# Patient Record
Sex: Male | Born: 1940 | Race: White | Hispanic: No | Marital: Married | Smoking: Never smoker
Health system: Southern US, Community
[De-identification: ages and names within clinical notes are randomized; demographics above are authoritative.]

## PROBLEM LIST (undated history)

## (undated) DIAGNOSIS — K7030 Alcoholic cirrhosis of liver without ascites: Secondary | ICD-10-CM

## (undated) DIAGNOSIS — D696 Thrombocytopenia, unspecified: Secondary | ICD-10-CM

## (undated) DIAGNOSIS — K7682 Hepatic encephalopathy: Secondary | ICD-10-CM

## (undated) DIAGNOSIS — D649 Anemia, unspecified: Secondary | ICD-10-CM

## (undated) DIAGNOSIS — R17 Unspecified jaundice: Secondary | ICD-10-CM

## (undated) DIAGNOSIS — K7290 Hepatic failure, unspecified without coma: Secondary | ICD-10-CM

## (undated) HISTORY — DX: Alcoholic cirrhosis of liver without ascites: K70.30

## (undated) HISTORY — PX: OTHER SURGICAL HISTORY: SHX169

---

## 2004-02-26 ENCOUNTER — Inpatient Hospital Stay (HOSPITAL_COMMUNITY): Admission: EM | Admit: 2004-02-26 | Discharge: 2004-02-27 | Payer: Self-pay | Admitting: Emergency Medicine

## 2004-02-26 ENCOUNTER — Ambulatory Visit (HOSPITAL_COMMUNITY): Admission: RE | Admit: 2004-02-26 | Discharge: 2004-02-26 | Payer: Self-pay | Admitting: Family Medicine

## 2004-04-26 ENCOUNTER — Encounter: Admission: RE | Admit: 2004-04-26 | Discharge: 2004-04-26 | Payer: Self-pay | Admitting: Neurosurgery

## 2004-06-03 ENCOUNTER — Encounter: Admission: RE | Admit: 2004-06-03 | Discharge: 2004-06-03 | Payer: Self-pay | Admitting: Neurosurgery

## 2004-08-08 ENCOUNTER — Encounter: Admission: RE | Admit: 2004-08-08 | Discharge: 2004-08-08 | Payer: Self-pay | Admitting: Neurosurgery

## 2012-06-08 ENCOUNTER — Inpatient Hospital Stay (HOSPITAL_COMMUNITY): Payer: Medicare Other

## 2012-06-08 ENCOUNTER — Emergency Department (HOSPITAL_COMMUNITY): Payer: Medicare Other

## 2012-06-08 ENCOUNTER — Encounter (HOSPITAL_COMMUNITY): Payer: Self-pay

## 2012-06-08 ENCOUNTER — Inpatient Hospital Stay (HOSPITAL_COMMUNITY)
Admission: EM | Admit: 2012-06-08 | Discharge: 2012-06-11 | DRG: 494 | Disposition: A | Discharge status: Home / Self Care | Payer: Medicare Other | Attending: Orthopedic Surgery | Admitting: Orthopedic Surgery

## 2012-06-08 DIAGNOSIS — S82841A Displaced bimalleolar fracture of right lower leg, initial encounter for closed fracture: Secondary | ICD-10-CM

## 2012-06-08 DIAGNOSIS — S82853A Displaced trimalleolar fracture of unspecified lower leg, initial encounter for closed fracture: Principal | ICD-10-CM | POA: Diagnosis present

## 2012-06-08 DIAGNOSIS — S82851A Displaced trimalleolar fracture of right lower leg, initial encounter for closed fracture: Secondary | ICD-10-CM

## 2012-06-08 DIAGNOSIS — F1010 Alcohol abuse, uncomplicated: Secondary | ICD-10-CM

## 2012-06-08 DIAGNOSIS — W11XXXA Fall on and from ladder, initial encounter: Secondary | ICD-10-CM | POA: Diagnosis present

## 2012-06-08 DIAGNOSIS — Z23 Encounter for immunization: Secondary | ICD-10-CM

## 2012-06-08 LAB — COMPREHENSIVE METABOLIC PANEL
AST: 70 U/L — ABNORMAL HIGH (ref 0–37)
CO2: 22 mEq/L (ref 19–32)
Chloride: 99 mEq/L (ref 96–112)
Creatinine, Ser: 1.05 mg/dL (ref 0.50–1.35)
GFR calc non Af Amer: 68 mL/min — ABNORMAL LOW (ref >90)
Glucose, Bld: 92 mg/dL (ref 70–99)
Total Bilirubin: 3.1 mg/dL — ABNORMAL HIGH (ref 0.3–1.2)

## 2012-06-08 LAB — TYPE AND SCREEN

## 2012-06-08 LAB — APTT: aPTT: 37 seconds (ref 24–37)

## 2012-06-08 LAB — PROTIME-INR
INR: 1.7 — ABNORMAL HIGH (ref 0.00–1.49)
Prothrombin Time: 19.4 seconds — ABNORMAL HIGH (ref 11.6–15.2)

## 2012-06-08 LAB — CBC
MCHC: 35.4 g/dL (ref 30.0–36.0)
Platelets: 90 10*3/uL — ABNORMAL LOW (ref 150–400)
RDW: 18.2 % — ABNORMAL HIGH (ref 11.5–15.5)

## 2012-06-08 LAB — POCT I-STAT TROPONIN I: Troponin i, poc: 0 ng/mL (ref 0.00–0.08)

## 2012-06-08 LAB — ETHANOL: Alcohol, Ethyl (B): 202 mg/dL — ABNORMAL HIGH (ref 0–11)

## 2012-06-08 MED ORDER — FENTANYL CITRATE 0.05 MG/ML IJ SOLN
50.0000 ug | Freq: Once | INTRAMUSCULAR | Status: AC
  Administered 2012-06-08: 50 ug via INTRAVENOUS
  Filled 2012-06-08: qty 2

## 2012-06-08 MED ORDER — FOLIC ACID 1 MG PO TABS: 1.0000 mg | ORAL_TABLET | Freq: Every day | ORAL | Status: DC

## 2012-06-08 MED ORDER — LORAZEPAM 1 MG PO TABS: 0.0000 mg | ORAL_TABLET | Freq: Two times a day (BID) | ORAL | Status: DC

## 2012-06-08 MED ORDER — MORPHINE SULFATE 2 MG/ML IJ SOLN: 0.5000 mg | INTRAMUSCULAR | Status: DC | PRN

## 2012-06-08 MED ORDER — HYDROCODONE-ACETAMINOPHEN 5-325 MG PO TABS: 1.0000 | ORAL_TABLET | Freq: Four times a day (QID) | ORAL | Status: DC | PRN

## 2012-06-08 MED ORDER — LORAZEPAM 1 MG PO TABS: 1.0000 mg | ORAL_TABLET | Freq: Four times a day (QID) | ORAL | Status: DC | PRN

## 2012-06-08 MED ORDER — SODIUM CHLORIDE 0.9 % IV BOLUS (SEPSIS)
500.0000 mL | Freq: Once | INTRAVENOUS | Status: AC
  Administered 2012-06-08: 500 mL via INTRAVENOUS

## 2012-06-08 MED ORDER — KCL IN DEXTROSE-NACL 20-5-0.45 MEQ/L-%-% IV SOLN
INTRAVENOUS | Status: AC
  Filled 2012-06-08: qty 1000

## 2012-06-08 MED ORDER — LORAZEPAM 1 MG PO TABS: 0.0000 mg | ORAL_TABLET | Freq: Four times a day (QID) | ORAL | Status: DC

## 2012-06-08 MED ORDER — LORAZEPAM 2 MG/ML IJ SOLN: 1.0000 mg | Freq: Four times a day (QID) | INTRAMUSCULAR | Status: DC | PRN

## 2012-06-08 MED ORDER — VITAMIN B-1 100 MG PO TABS: 100.0000 mg | ORAL_TABLET | Freq: Every day | ORAL | Status: DC

## 2012-06-08 MED ORDER — ADULT MULTIVITAMIN W/MINERALS CH: 1.0000 | ORAL_TABLET | Freq: Every day | ORAL | Status: DC

## 2012-06-08 MED ORDER — KETAMINE HCL 50 MG/ML IJ SOLN
INTRAMUSCULAR | Status: AC
  Administered 2012-06-08: 100 mg via INTRAVENOUS
  Filled 2012-06-08: qty 1

## 2012-06-08 MED ORDER — KCL IN DEXTROSE-NACL 20-5-0.45 MEQ/L-%-% IV SOLN
INTRAVENOUS | Status: DC
  Administered 2012-06-08: 1000 mL via INTRAVENOUS
  Filled 2012-06-08 (×4): qty 1000

## 2012-06-08 MED ORDER — KETAMINE HCL 10 MG/ML IJ SOLN: 1.5000 mg/kg | Freq: Once | INTRAMUSCULAR | Status: AC

## 2012-06-08 MED ORDER — THIAMINE HCL 100 MG/ML IJ SOLN: 100.0000 mg | Freq: Every day | INTRAMUSCULAR | Status: DC

## 2012-06-08 NOTE — ED Notes (Signed)
More alert states he does not remember anyone pulling on his ankle. Pt talking w/ family NAD noted. Pt remains on cardiac monitor w/ NIBP monitor & capnography monitoring

## 2012-06-08 NOTE — ED Notes (Signed)
Pt still w/ sensation to right lower ext. Pt able to move toes & feels toes being touched. caprefill < 2 secs.

## 2012-06-08 NOTE — ED Notes (Signed)
Pt reports was on a 2foot step ladder in his house and fell.   EMS reports obvious deformity to r ankle.  Pt denies any pain at this time.

## 2012-06-08 NOTE — ED Notes (Signed)
Pt speaking to family when they came in to the room.

## 2012-06-08 NOTE — Progress Notes (Signed)
Patient ID: Bradley Beltran., male   DOB: 06/12/1940, 72 y.o.   MRN: [id number]  Right ankle fracture dislocation   alcohol history : heavy  Will try to operate in am

## 2012-06-08 NOTE — ED Notes (Signed)
Attempted to call report. Was advised nurse receiving pt will call this nurse back for report. 

## 2012-06-08 NOTE — ED Notes (Signed)
Pt resting calmly w/ eyes closed. Rise & fall of the chest noted. Family at bedside. Bed in low position, side rails up x2. NAD noted at this time.  

## 2012-06-08 NOTE — ED Provider Notes (Signed)
History  This chart was scribed for Hilario Quarry, MD, by Candelaria Stagers, ED Scribe. This patient was seen in room APA18/APA18 and the patient's care was started at 4:38 PM   CSN: [id number]  Arrival date & time 06/08/12  1623   None     Chief Complaint  Patient presents with  . Ankle Injury     The history is provided by the patient and the EMS personnel. No language interpreter was used.   Bradley Beltran is a 72 y.o. male who presents to the Emergency Department BIBA complaining of right ankle pain after climbing a 3 ft ladder, losing balance, and stumbling to the ground.  He denies hitting his head or LOC.  Pt reports he drank 4-5 beers earlier this morning.  He has no health problems.  Pt reports no other injuries.  He denied pain medication in route.       History reviewed. No pertinent past medical history.  History reviewed. No pertinent past surgical history.  No family history on file.  History  Substance Use Topics  . Smoking status: Never Smoker   . Smokeless tobacco: Not on file  . Alcohol Use: Yes     Comment: 4 or 5 beers a day      Review of Systems  Musculoskeletal: Positive for arthralgias (right ankle pain).  Neurological: Negative for syncope.  All other systems reviewed and are negative.    Allergies  Review of patient's allergies indicates no known allergies.  Home Medications  No current outpatient prescriptions on file.  There were no vitals taken for this visit.  Physical Exam  Nursing note and vitals reviewed. Constitutional: He is oriented to person, place, and time. He appears well-developed and well-nourished. No distress.  HENT:  Head: Normocephalic and atraumatic.  Eyes: Right eye exhibits no discharge. Left eye exhibits no discharge.  Neck: Normal range of motion.  Pulmonary/Chest: Breath sounds normal. No respiratory distress.  Musculoskeletal:  Swelling and tenderness diffusely over the right ankle.  DP intact, 2+.   Sensation intact.  No open wounds.  No tenderness over right knee or right hip.   Neurological: He is alert and oriented to person, place, and time.  Skin: Skin is warm and dry. He is not diaphoretic.  Psychiatric: He has a normal mood and affect. His behavior is normal.    ED Course  Procedures   DIAGNOSTIC STUDIES:   COORDINATION OF CARE:  4:29 PM Ordered: DG Ankle Complete Right; DG Tibia/Fibula Right; 4:39 PM Ordered: DG Chest 1 View; Ethanol; I-Stat, Chem 8 6:32 PM Discussed reduction of dislocated ankle and need for sedation.  Pt understands and agrees.   6:45 PM Ordered: fentanyl (Sublimaze) injection 50 mcg; ketamine (Ketalar) injection 50 MG/ML  REDUCTION PROCEDURE NOTE: Patient identification was confirmed, consent was obtained  Verbally and id bracelet .  This procedure was performed at 6:59 PM by Hilario Quarry, MD Site right ankle posterior dislocation Anesthetic used (type and amt): ketamine (Ketalar) 50 MG/ML injection Pre-procedure N/V exam # of attempts: 2 Type of splint: posterior splint   Pt anesthetized, fx/dislocation reduced successfully.  Patient tolerated procedure well without complications.  Patient splinted. Post-procedure exam indicates patient is n/v intact distal to the injury site.  Post-procedure films show improved alignment.  Patient returned to baseline prior to disposition.  Instructions for care discussed verbally and patient provided with additional written instructions for homecare and f/u.  Preprocedure  Pre-anesthesia/induction confirmation of laterality/correct procedure site including "time-out."  Provider confirms review of the nurses' note, allergies, medications, pertinent labs, PMH, pre-induction vital signs, pulse oximetry, pain level, and ECG (as applicable), and patient condition satisfactory for commencing with order for sedation and procedure.  Splinting performed by edp with rn assistance and post reduction check with good  neurovascular check.  STirrup splint placed.  8:35 PM Recheck: Discussed plan with pt's wife which include watching over the next hour to determine if he can return home tonight.  She understands and agrees.   Labs Reviewed  ETHANOL - Abnormal; Notable for the following:    Alcohol, Ethyl (B) 202 (*)    All other components within normal limits  POCT I-STAT TROPONIN I   Dg Chest 1 View  06/08/2012  *RADIOLOGY REPORT*  Clinical Data: Fall  CHEST - 1 VIEW  Comparison: 02/27/2004  Findings: The cardiomediastinal silhouette is within normal limits. Lungs are clear.  No pneumothorax.  No definite acute bony deformity.  Chronic left lateral basal rib deformity with healing is present.  IMPRESSION: No active cardiopulmonary disease.   Original Report Authenticated By: Jolaine Click, M.D.    Dg Tibia/fibula Right  06/08/2012  *RADIOLOGY REPORT*  Clinical Data: Injury  RIGHT TIBIA AND FIBULA - 2 VIEW  Comparison: None.  Findings: The proximal tibia and fibula are imaged.  No evidence of acute fracture or dislocation involving the proximal or mid tibia or fibula.  IMPRESSION: No evidence of acute bony injury in the proximal or mid tibia and fibula.  The ankle fracture dislocation is discussed in the ankle series.   Original Report Authenticated By: Jolaine Click, M.D.    Dg Ankle 2 Views Right  06/08/2012  *RADIOLOGY REPORT*  Clinical Data: 72 year old male status post reduction right ankle fracture.  RIGHT ANKLE - 2 VIEW  Comparison: 1748 hours the same day.  Findings: AP and cross-table lateral views with splint material in place.  Mortise joint alignment improved on the lateral view, posterior subluxation remains.  AP view alignment not significantly changed.  No new injury identified.  IMPRESSION: Improved mortise joint alignment on the lateral view with residual posterior subluxation.  Alignment on the AP view not significantly changed.   Original Report Authenticated By: Erskine Speed, M.D.    Dg Ankle  Complete Right  06/08/2012  *RADIOLOGY REPORT*  Clinical Data: Ankle injury  RIGHT ANKLE - COMPLETE 3+ VIEW  Comparison: None.  Findings: There is a fracture of the distal fibula beginning at the tibial plafond and extending superiorly and posteriorly.  Fracture of the medial malleolus occurs at the tibial plafond with displacement. There is near complete anterior dislocation of the tibial plafond with respect to the talar dome.  Talus and calcaneus are intact.  IMPRESSION: Ankle fracture and dislocation as described above involving the medial and lateral malleoli.   Original Report Authenticated By: Jolaine Click, M.D.      No diagnosis found.   I personally performed the services described in this documentation, which was scribed in my presence. The recorded information has been reviewed and considered.  MDM  Patient awake and alert post conscious sedation but continues to be sleepy and does not appear ready for ambulation with crutches.  Discussed with Dr. Romeo Apple and he will write admit orders.  Toes remain pink with cap refill less than 1 second and patient with improved pain.  Discussed with patient, family, and Dr. Romeo Apple possible need for etoh withdrawal protocol but patient without any signs of etoh w/d now.  Hilario Quarry, MD 06/08/12 2202

## 2012-06-08 NOTE — ED Notes (Signed)
Pt talking to family denies any pain. No needs voiced. Pt remains on cardiac monitor w/ NIBP monitoring.

## 2012-06-09 ENCOUNTER — Encounter (HOSPITAL_COMMUNITY)
Admission: EM | Disposition: A | Discharge status: Home / Self Care | Payer: Self-pay | Source: Home / Self Care | Attending: Orthopedic Surgery

## 2012-06-09 ENCOUNTER — Encounter (HOSPITAL_COMMUNITY): Payer: Self-pay | Admitting: Anesthesiology

## 2012-06-09 ENCOUNTER — Encounter (HOSPITAL_COMMUNITY): Payer: Self-pay

## 2012-06-09 ENCOUNTER — Inpatient Hospital Stay (HOSPITAL_COMMUNITY): Payer: Medicare Other | Admitting: Anesthesiology

## 2012-06-09 ENCOUNTER — Inpatient Hospital Stay (HOSPITAL_COMMUNITY): Payer: Medicare Other

## 2012-06-09 DIAGNOSIS — S82853A Displaced trimalleolar fracture of unspecified lower leg, initial encounter for closed fracture: Principal | ICD-10-CM

## 2012-06-09 DIAGNOSIS — S82851A Displaced trimalleolar fracture of right lower leg, initial encounter for closed fracture: Secondary | ICD-10-CM

## 2012-06-09 HISTORY — PX: ORIF ANKLE FRACTURE: SHX5408

## 2012-06-09 LAB — BASIC METABOLIC PANEL
BUN: 6 mg/dL (ref 6–23)
Creatinine, Ser: 0.99 mg/dL (ref 0.50–1.35)
GFR calc Af Amer: >90 mL/min (ref >90)
GFR calc non Af Amer: 79 mL/min — ABNORMAL LOW (ref >90)
Potassium: 4.6 mEq/L (ref 3.5–5.1)

## 2012-06-09 LAB — SURGICAL PCR SCREEN
MRSA, PCR: NEGATIVE
Staphylococcus aureus: POSITIVE — AB

## 2012-06-09 LAB — CBC
Hemoglobin: 10 g/dL — ABNORMAL LOW (ref 13.0–17.0)
MCH: 35.3 pg — ABNORMAL HIGH (ref 26.0–34.0)
Platelets: 69 10*3/uL — ABNORMAL LOW (ref 150–400)
RBC: 2.83 MIL/uL — ABNORMAL LOW (ref 4.22–5.81)
WBC: 7.5 10*3/uL (ref 4.0–10.5)

## 2012-06-09 SURGERY — OPEN REDUCTION INTERNAL FIXATION (ORIF) ANKLE FRACTURE
Anesthesia: Spinal | Site: Ankle | Laterality: Right | Wound class: Clean

## 2012-06-09 MED ORDER — HYDROCODONE-ACETAMINOPHEN 5-325 MG PO TABS
1.0000 | ORAL_TABLET | Freq: Four times a day (QID) | ORAL | Status: DC | PRN
  Administered 2012-06-09: 2 via ORAL
  Administered 2012-06-10 (×2): 1 via ORAL
  Administered 2012-06-10: 2 via ORAL
  Filled 2012-06-09 (×4): qty 2

## 2012-06-09 MED ORDER — LORAZEPAM 1 MG PO TABS: 0.0000 mg | ORAL_TABLET | Freq: Two times a day (BID) | ORAL | Status: DC

## 2012-06-09 MED ORDER — SENNA 8.6 MG PO TABS
1.0000 | ORAL_TABLET | Freq: Two times a day (BID) | ORAL | Status: DC
  Administered 2012-06-09 – 2012-06-11 (×5): 8.6 mg via ORAL
  Filled 2012-06-09 (×5): qty 1

## 2012-06-09 MED ORDER — CEFAZOLIN SODIUM-DEXTROSE 2-3 GM-% IV SOLR
INTRAVENOUS | Status: AC
  Filled 2012-06-09: qty 50

## 2012-06-09 MED ORDER — HEMOSTATIC AGENTS (NO CHARGE) OPTIME
TOPICAL | Status: DC | PRN
  Administered 2012-06-09: 1 via TOPICAL

## 2012-06-09 MED ORDER — ONDANSETRON HCL 4 MG/2ML IJ SOLN: 4.0000 mg | Freq: Once | INTRAMUSCULAR | Status: AC | PRN

## 2012-06-09 MED ORDER — FENTANYL CITRATE 0.05 MG/ML IJ SOLN: 25.0000 ug | INTRAMUSCULAR | Status: DC | PRN

## 2012-06-09 MED ORDER — MORPHINE SULFATE 2 MG/ML IJ SOLN
2.0000 mg | INTRAMUSCULAR | Status: DC | PRN
  Filled 2012-06-09: qty 1

## 2012-06-09 MED ORDER — FENTANYL CITRATE 0.05 MG/ML IJ SOLN
INTRAMUSCULAR | Status: AC
  Filled 2012-06-09: qty 2

## 2012-06-09 MED ORDER — METOCLOPRAMIDE HCL 5 MG/ML IJ SOLN: 5.0000 mg | Freq: Three times a day (TID) | INTRAMUSCULAR | Status: DC | PRN

## 2012-06-09 MED ORDER — BUPIVACAINE IN DEXTROSE 0.75-8.25 % IT SOLN
INTRATHECAL | Status: DC | PRN
  Administered 2012-06-09: 13.5 mg via INTRATHECAL

## 2012-06-09 MED ORDER — SODIUM CHLORIDE 0.9 % IR SOLN
Status: DC | PRN
  Administered 2012-06-09 (×2): 1000 mL

## 2012-06-09 MED ORDER — CHLORHEXIDINE GLUCONATE CLOTH 2 % EX PADS
6.0000 | MEDICATED_PAD | Freq: Every day | CUTANEOUS | Status: DC
  Administered 2012-06-10 – 2012-06-11 (×2): 6 via TOPICAL

## 2012-06-09 MED ORDER — METOCLOPRAMIDE HCL 10 MG PO TABS: 5.0000 mg | ORAL_TABLET | Freq: Three times a day (TID) | ORAL | Status: DC | PRN

## 2012-06-09 MED ORDER — LORAZEPAM 2 MG/ML IJ SOLN: 1.0000 mg | Freq: Four times a day (QID) | INTRAMUSCULAR | Status: DC | PRN

## 2012-06-09 MED ORDER — PHENOL 1.4 % MT LIQD: 1.0000 | OROMUCOSAL | Status: DC | PRN

## 2012-06-09 MED ORDER — LACTATED RINGERS IV SOLN
INTRAVENOUS | Status: DC
  Administered 2012-06-09 (×3): via INTRAVENOUS

## 2012-06-09 MED ORDER — CEFAZOLIN SODIUM-DEXTROSE 2-3 GM-% IV SOLR
2.0000 g | Freq: Four times a day (QID) | INTRAVENOUS | Status: AC
  Administered 2012-06-09: 2 g via INTRAVENOUS
  Filled 2012-06-09 (×2): qty 50

## 2012-06-09 MED ORDER — BUPIVACAINE-EPINEPHRINE PF 0.5-1:200000 % IJ SOLN
INTRAMUSCULAR | Status: DC | PRN
  Administered 2012-06-09: 60 mL

## 2012-06-09 MED ORDER — MORPHINE SULFATE 2 MG/ML IJ SOLN: 0.5000 mg | INTRAMUSCULAR | Status: DC | PRN

## 2012-06-09 MED ORDER — MIDAZOLAM HCL 5 MG/5ML IJ SOLN
INTRAMUSCULAR | Status: DC | PRN
  Administered 2012-06-09 (×2): 2 mg via INTRAVENOUS

## 2012-06-09 MED ORDER — DOCUSATE SODIUM 100 MG PO CAPS
100.0000 mg | ORAL_CAPSULE | Freq: Two times a day (BID) | ORAL | Status: DC
  Administered 2012-06-09 – 2012-06-11 (×5): 100 mg via ORAL
  Filled 2012-06-09 (×6): qty 1

## 2012-06-09 MED ORDER — THIAMINE HCL 100 MG/ML IJ SOLN: 100.0000 mg | Freq: Every day | INTRAMUSCULAR | Status: DC

## 2012-06-09 MED ORDER — INFLUENZA VIRUS VACC SPLIT PF IM SUSP
0.5000 mL | INTRAMUSCULAR | Status: AC
  Administered 2012-06-10: 0.5 mL via INTRAMUSCULAR
  Filled 2012-06-09: qty 0.5

## 2012-06-09 MED ORDER — ACETAMINOPHEN 10 MG/ML IV SOLN
INTRAVENOUS | Status: AC
  Filled 2012-06-09: qty 100

## 2012-06-09 MED ORDER — MIDAZOLAM HCL 2 MG/2ML IJ SOLN
INTRAMUSCULAR | Status: AC
  Filled 2012-06-09: qty 2

## 2012-06-09 MED ORDER — SENNOSIDES-DOCUSATE SODIUM 8.6-50 MG PO TABS: 1.0000 | ORAL_TABLET | Freq: Every evening | ORAL | Status: DC | PRN

## 2012-06-09 MED ORDER — MAGNESIUM CITRATE PO SOLN: 1.0000 | Freq: Once | ORAL | Status: AC | PRN

## 2012-06-09 MED ORDER — ONDANSETRON HCL 4 MG PO TABS: 4.0000 mg | ORAL_TABLET | Freq: Four times a day (QID) | ORAL | Status: DC | PRN

## 2012-06-09 MED ORDER — FENTANYL CITRATE 0.05 MG/ML IJ SOLN
25.0000 ug | INTRAMUSCULAR | Status: DC | PRN
  Administered 2012-06-09: 25 ug via INTRAVENOUS

## 2012-06-09 MED ORDER — MIDAZOLAM HCL 2 MG/2ML IJ SOLN
1.0000 mg | INTRAMUSCULAR | Status: DC | PRN
  Administered 2012-06-09: 2 mg via INTRAVENOUS

## 2012-06-09 MED ORDER — LORAZEPAM 1 MG PO TABS
0.0000 mg | ORAL_TABLET | Freq: Four times a day (QID) | ORAL | Status: AC
  Filled 2012-06-09: qty 1

## 2012-06-09 MED ORDER — MUPIROCIN 2 % EX OINT
TOPICAL_OINTMENT | Freq: Two times a day (BID) | CUTANEOUS | Status: DC
  Administered 2012-06-09 – 2012-06-10 (×3): via NASAL

## 2012-06-09 MED ORDER — PNEUMOCOCCAL VAC POLYVALENT 25 MCG/0.5ML IJ INJ
0.5000 mL | INJECTION | INTRAMUSCULAR | Status: AC
  Administered 2012-06-10: 0.5 mL via INTRAMUSCULAR
  Filled 2012-06-09: qty 0.5

## 2012-06-09 MED ORDER — ENOXAPARIN SODIUM 40 MG/0.4ML ~~LOC~~ SOLN: 40.0000 mg | SUBCUTANEOUS | Status: DC

## 2012-06-09 MED ORDER — ONDANSETRON HCL 4 MG/2ML IJ SOLN: 4.0000 mg | Freq: Four times a day (QID) | INTRAMUSCULAR | Status: DC | PRN

## 2012-06-09 MED ORDER — PHENYLEPHRINE HCL 10 MG/ML IJ SOLN
INTRAMUSCULAR | Status: DC | PRN
  Administered 2012-06-09 (×7): 100 ug via INTRAVENOUS

## 2012-06-09 MED ORDER — CEFAZOLIN SODIUM-DEXTROSE 2-3 GM-% IV SOLR
2.0000 g | INTRAVENOUS | Status: DC
  Administered 2012-06-09: 2 g via INTRAVENOUS
  Filled 2012-06-09: qty 50

## 2012-06-09 MED ORDER — ACETAMINOPHEN 10 MG/ML IV SOLN
1000.0000 mg | Freq: Four times a day (QID) | INTRAVENOUS | Status: AC
  Administered 2012-06-09 – 2012-06-10 (×4): 1000 mg via INTRAVENOUS
  Filled 2012-06-09 (×3): qty 100

## 2012-06-09 MED ORDER — MUPIROCIN 2 % EX OINT
TOPICAL_OINTMENT | CUTANEOUS | Status: AC
  Filled 2012-06-09: qty 22

## 2012-06-09 MED ORDER — ALUM & MAG HYDROXIDE-SIMETH 200-200-20 MG/5ML PO SUSP: 30.0000 mL | ORAL | Status: DC | PRN

## 2012-06-09 MED ORDER — PROPOFOL INFUSION 10 MG/ML OPTIME
INTRAVENOUS | Status: DC | PRN
  Administered 2012-06-09: 30 ug/kg/min via INTRAVENOUS

## 2012-06-09 MED ORDER — MENTHOL 3 MG MT LOZG: 1.0000 | LOZENGE | OROMUCOSAL | Status: DC | PRN

## 2012-06-09 MED ORDER — VITAMIN B-1 100 MG PO TABS
100.0000 mg | ORAL_TABLET | Freq: Every day | ORAL | Status: DC
  Administered 2012-06-09 – 2012-06-11 (×3): 100 mg via ORAL
  Filled 2012-06-09 (×3): qty 1

## 2012-06-09 MED ORDER — BISACODYL 10 MG RE SUPP: 10.0000 mg | Freq: Every day | RECTAL | Status: DC | PRN

## 2012-06-09 MED ORDER — FOLIC ACID 1 MG PO TABS
1.0000 mg | ORAL_TABLET | Freq: Every day | ORAL | Status: DC
  Administered 2012-06-09 – 2012-06-11 (×3): 1 mg via ORAL
  Filled 2012-06-09 (×3): qty 1

## 2012-06-09 MED ORDER — FENTANYL CITRATE 0.05 MG/ML IJ SOLN
INTRAMUSCULAR | Status: DC | PRN
  Administered 2012-06-09 (×2): 25 ug via INTRAVENOUS
  Administered 2012-06-09: 25 ug via INTRATHECAL

## 2012-06-09 MED ORDER — ADULT MULTIVITAMIN W/MINERALS CH
1.0000 | ORAL_TABLET | Freq: Every day | ORAL | Status: DC
  Administered 2012-06-09 – 2012-06-11 (×3): 1 via ORAL
  Filled 2012-06-09 (×3): qty 1

## 2012-06-09 MED ORDER — CHLORHEXIDINE GLUCONATE 4 % EX LIQD
60.0000 mL | Freq: Once | CUTANEOUS | Status: AC
  Administered 2012-06-09: 4 via TOPICAL
  Filled 2012-06-09: qty 60

## 2012-06-09 MED ORDER — BUPIVACAINE-EPINEPHRINE PF 0.5-1:200000 % IJ SOLN
INTRAMUSCULAR | Status: AC
  Filled 2012-06-09: qty 20

## 2012-06-09 MED ORDER — LORAZEPAM 1 MG PO TABS
1.0000 mg | ORAL_TABLET | Freq: Four times a day (QID) | ORAL | Status: DC | PRN
  Administered 2012-06-09 – 2012-06-10 (×2): 1 mg via ORAL
  Filled 2012-06-09 (×2): qty 1

## 2012-06-09 SURGICAL SUPPLY — 63 items
1.4 k-wire ×2 IMPLANT
BAG HAMPER (MISCELLANEOUS) ×2 IMPLANT
BANDAGE ELASTIC 3 VELCRO NS (GAUZE/BANDAGES/DRESSINGS) ×2 IMPLANT
BANDAGE ELASTIC 4 VELCRO NS (GAUZE/BANDAGES/DRESSINGS) ×2 IMPLANT
BANDAGE ESMARK 4X12 BL STRL LF (DISPOSABLE) ×1 IMPLANT
BIT DRILL 2.5X110 QC LCP DISP (BIT) IMPLANT
BIT DRILL 2.8 (BIT)
BIT DRILL CANN QC 2.8X165 (BIT) IMPLANT
BIT DRILL QC 3.5X110 (BIT) IMPLANT
BLADE SURG SZ10 CARB STEEL (BLADE) ×2 IMPLANT
BNDG CMPR 12X4 ELC STRL LF (DISPOSABLE) ×1
BNDG COHESIVE 4X5 TAN STRL (GAUZE/BANDAGES/DRESSINGS) ×2 IMPLANT
BNDG ESMARK 4X12 BLUE STRL LF (DISPOSABLE) ×2
CHLORAPREP W/TINT 26ML (MISCELLANEOUS) ×4 IMPLANT
CLOTH BEACON ORANGE TIMEOUT ST (SAFETY) ×2 IMPLANT
COVER LIGHT HANDLE STERIS (MISCELLANEOUS) ×4 IMPLANT
CUFF TOURNIQUET SINGLE 34IN LL (TOURNIQUET CUFF) ×2 IMPLANT
DRAPE C-ARM FOLDED MOBILE STRL (DRAPES) ×2 IMPLANT
DRAPE PROXIMA HALF (DRAPES) ×2 IMPLANT
DRILL 2.6X122MM WL AO SHAFT (BIT) ×2 IMPLANT
DRILL BIT 2.8MM (BIT)
GAUZE XEROFORM 5X9 LF (GAUZE/BANDAGES/DRESSINGS) ×2 IMPLANT
GLOVE BIOGEL PI IND STRL 7.0 (GLOVE) ×1 IMPLANT
GLOVE BIOGEL PI INDICATOR 7.0 (GLOVE) ×1
GLOVE EXAM NITRILE MD LF STRL (GLOVE) ×4 IMPLANT
GLOVE OPTIFIT SS 6.5 STRL BRWN (GLOVE) ×2 IMPLANT
GLOVE SKINSENSE NS SZ8.0 LF (GLOVE) ×1
GLOVE SKINSENSE STRL SZ8.0 LF (GLOVE) ×1 IMPLANT
GLOVE SS N UNI LF 8.5 STRL (GLOVE) ×2 IMPLANT
GOWN STRL REIN XL XLG (GOWN DISPOSABLE) ×4 IMPLANT
INST SET MINOR BONE (KITS) ×2 IMPLANT
K-WIRE 1.25 TRCR POINT 150 (WIRE)
K-WIRE 1.6X150 (WIRE)
K-WIRE 2.0X150M (WIRE)
KIT ROOM TURNOVER APOR (KITS) ×2 IMPLANT
KWIRE 1.25 TRCR POINT 150 (WIRE) IMPLANT
KWIRE 1.6X150 (WIRE) IMPLANT
KWIRE 2.0X150M (WIRE) IMPLANT
MANIFOLD NEPTUNE II (INSTRUMENTS) ×2 IMPLANT
NEEDLE HYPO 21X1.5 SAFETY (NEEDLE) ×2 IMPLANT
NS IRRIG 1000ML POUR BTL (IV SOLUTION) ×2 IMPLANT
PACK BASIC LIMB (CUSTOM PROCEDURE TRAY) ×2 IMPLANT
PAD ABD 5X9 TENDERSORB (GAUZE/BANDAGES/DRESSINGS) ×2 IMPLANT
PAD ARMBOARD 7.5X6 YLW CONV (MISCELLANEOUS) ×2 IMPLANT
PAD CAST 4YDX4 CTTN HI CHSV (CAST SUPPLIES) ×1 IMPLANT
PADDING CAST COTTON 4X4 STRL (CAST SUPPLIES) ×2
PLATE FIBULA 5H (Plate) ×2 IMPLANT
SCREW BONE 14MMX3.5MM (Screw) ×4 IMPLANT
SCREW BONE 18 (Screw) ×6 IMPLANT
SCREW BONE 3.5X16MM (Screw) ×4 IMPLANT
SET BASIN LINEN APH (SET/KITS/TRAYS/PACK) ×2 IMPLANT
SPLINT IMMOBILIZER J 3INX20FT (CAST SUPPLIES)
SPLINT J IMMOBILIZER 3X20FT (CAST SUPPLIES) IMPLANT
SPLINT J IMMOBILIZER 4X20FT (CAST SUPPLIES) ×1 IMPLANT
SPLINT J PLASTER J 4INX20Y (CAST SUPPLIES) ×1
SPONGE GAUZE 4X4 12PLY (GAUZE/BANDAGES/DRESSINGS) ×2 IMPLANT
SPONGE LAP 18X18 X RAY DECT (DISPOSABLE) ×2 IMPLANT
STAPLER VISISTAT 35W (STAPLE) ×2 IMPLANT
SUT ETHILON 3 0 FSL (SUTURE) ×2 IMPLANT
SUT MON AB 0 CT1 (SUTURE) ×2 IMPLANT
SUT MON AB 2-0 CT1 36 (SUTURE) ×2 IMPLANT
SYR 30ML LL (SYRINGE) ×2 IMPLANT
SYR BULB IRRIGATION 50ML (SYRINGE) ×2 IMPLANT

## 2012-06-09 NOTE — Anesthesia Postprocedure Evaluation (Addendum)
  Anesthesia Post-op Note  Patient: Bradley Beltran.  Procedure(s) Performed: Procedure(s): OPEN REDUCTION INTERNAL FIXATION (ORIF) ANKLE FRACTURE (Right)  Patient Location: PACU  Anesthesia Type:Spinal  Level of Consciousness: awake, alert  and oriented  Airway and Oxygen Therapy: Patient Spontanous Breathing and Patient connected to nasal cannula oxygen  Post-op Pain: none  Post-op Assessment: Post-op Vital signs reviewed, Patient's Cardiovascular Status Stable, Respiratory Function Stable, Patent Airway and No signs of Nausea or vomiting  Post-op Vital Signs: Reviewed and stable  Complications: No apparent anesthesia complications 06/10/12  Alert, VSS.  No apparent anesthesia complications.

## 2012-06-09 NOTE — Progress Notes (Signed)
Patient ID: Bradley Beltran., male   DOB: 01/12/1941, 72 y.o.   MRN: [id number] Post op check   Stables no signs of DTs   Starting to hurt

## 2012-06-09 NOTE — Anesthesia Procedure Notes (Signed)
Spinal  Patient location during procedure: OR Start time: 06/09/2012 7:51 AM Staffing CRNA/Resident: Glynn Octave E Preanesthetic Checklist Completed: patient identified, site marked, surgical consent, pre-op evaluation, timeout performed, IV checked, risks and benefits discussed and monitors and equipment checked Spinal Block Patient position: right lateral decubitus Prep: Betadine Patient monitoring: heart rate, cardiac monitor, continuous pulse ox and blood pressure Approach: right paramedian Location: L3-4 Injection technique: single-shot Needle Needle type: Spinocan  Needle gauge: 22 G Needle length: 9 cm Assessment Sensory level: T8 Additional Notes ATTEMPTS:2, first attempt paramedian, second median with success. TRAY ZO:[id number] TRAY EXPIRATION DATE:03/2013

## 2012-06-09 NOTE — Brief Op Note (Signed)
06/08/2012 - 06/09/2012  9:21 AM  PATIENT:  Bradley Beltran.  72 y.o. male  PRE-OPERATIVE DIAGNOSIS:  trimalleolar right ankle fracture dislocation  POST-OPERATIVE DIAGNOSIS:  trimalleolar right ankle fracture dislocation  PROCEDURE:  Procedure(s): OPEN REDUCTION INTERNAL FIXATION (ORIF) ANKLE FRACTURE (Right)  Implants: Stryker ankle system with 2 medial threaded K wires   Operative findings oblique Weber B. type lateral malleolus fracture with soft bone, oblique medial malleolus fracture starting at the anteromedial joint line and progressing posteriorly  Details of procedure  Site marking and site confirmation were performed in the preop area. The patient was taken to the operating room for spinal anesthetic. After spinal anesthesia he was placed supine in his right leg was prepped and draped sterilely. The tourniquet was applied to the proximal thigh.  After timeout was completed, the tourniquet was elevated to 300 mm of mercury where it stayed for 1 hour. We then made an incision over the fibula creating a full-thickness skin flap. We debrided the fracture site perform manual reduction and held with a bone clamp. Radiographs was taken at that point to confirm reduction of the fracture and the mortise.  We then placed the plate and screw construct over the fibula confirming position with x-ray. We then turned our attention to the medial side where straight incision was made over the fracture full-thickness skin flaps were created. The fracture was opened the joint was irrigated. Inspection of the joint we will no cartilage lesion of the talar dome.  A reduction clamp was used to hold the medial malleolus reduced. 2 K wires were then placed. X-rays confirmed reduction. The K wires were backed out bent and then buried in bone. Repeat radiograph confirmed reduction.  Both wounds were copiously irrigated the medial wound was closed with 3-0 nylon suture the lateral wound was closed with 0  Monocryl and staples.  60 cc of Marcaine injected as a block of the superficial peroneal nerve and the sural nerve and the wound edges  The dressing was placed with Surgicel Xeroform 4 x 4's ABDs.  The tourniquet was released and a posterior splint was applied with the foot held in neutral position    Patient taken to the recovery room stable condition  Plan nonweightbearing 6 weeks  cast 6 weeks then brace 6 weeks.  SURGEON:  Surgeon(s) and Role:    * Vickki Hearing, MD - Primary  PHYSICIAN ASSISTANT:   ASSISTANTS: none   ANESTHESIA:   spinal  EBL:  Total I/O In: 2000 [I.V.:2000] Out: 50 [Urine:50]  BLOOD ADMINISTERED:none  DRAINS: none   LOCAL MEDICATIONS USED:  MARCAINE   , Amount: 60 ml and OTHER epi  SPECIMEN:  No Specimen  DISPOSITION OF SPECIMEN:  N/A  COUNTS:  YES  TOURNIQUET:   Total Tourniquet Time Documented: Thigh (Right) - 60 minutes Total: Thigh (Right) - 60 minutes   DICTATION: .Reubin Milan Dictation  PLAN OF CARE: Admit to inpatient   PATIENT DISPOSITION:  PACU - hemodynamically stable.   Delay start of Pharmacological VTE agent (>24hrs) due to surgical blood loss or risk of bleeding: yes

## 2012-06-09 NOTE — Care Management Note (Signed)
    Page 1 of 2   06/11/2012     10:40:09 AM   CARE MANAGEMENT NOTE 06/11/2012  Patient:  Bradley Beltran, Bradley Beltran   Account Number:  [id number]  Date Initiated:  06/09/2012  Documentation initiated by:  Sharrie Rothman  Subjective/Objective Assessment:   Pt admitted from home with right ankle fracture. Pt is s/p surgery 06/09/12. Pt lives with his wife and will return home at discharge. Pt was independent with ADL's prior to admission.     Action/Plan:   Pts wife requested to speak with financial counselor in regards to assistance with hospital bill. Will continue to follow for DME and HH needs once assessed by PT.   Anticipated DC Date:  06/11/2012   Anticipated DC Plan:  HOME W HOME HEALTH SERVICES  In-house referral  Financial Counselor      DC Planning Services  CM consult      [facility] Choice  DURABLE MEDICAL EQUIPMENT   Choice offered to / List presented to:  C-3 Spouse   DME arranged  BEDSIDE COMMODE  WHEELCHAIR - MANUAL      DME agency  [facility]        Status of service:  Completed, signed off Medicare Important Message given?  YES (If response is "NO", the following Medicare IM given date fields will be blank) Date Medicare IM given:  06/11/2012 Date Additional Medicare IM given:    Discharge Disposition:  HOME/SELF CARE  Per UR Regulation:    If discussed at Long Length of Stay Meetings, dates discussed:    Comments:  06/11/12 1040 Arlyss Queen, RN BSN CM Pt discharged home today. No HH needed at this time. Pt required wheelchair and BSC. Scripts given to pts wife to go by [facility] in [place] to obtain DME per their request. Pt and pts nurse aware of discharge arrangements.  06/10/12 1140 Arlyss Queen, RN BSN CM PT recommended SNF vs CIR for rehab. Pt stated that he is going home at discharge with [facility]. Pt has 2 walkers for home use. CM will arrange HH once orders written.  06/09/12 1410 Arlyss Queen, RN BSN CM

## 2012-06-09 NOTE — H&P (Signed)
Bradley Beltran. is an 72 y.o. male.   Chief Complaint: RIGHT ANKLE PAIN  HPI: HE BASICALLY FELL OFF A STEP LADDER AND HAS A TRIMALL CLOSED RIGHT ANKLE FRACTURE. PAIN CONTROLLED. REDUCED IN THE ER.  LOOK STABLE   History reviewed. No pertinent past medical history.  History reviewed. No pertinent past surgical history.  No family history on file. Social History:  reports that he has never smoked. He does not have any smokeless tobacco history on file. He reports that  drinks alcohol. He reports that he does not use illicit drugs.  Allergies: No Known Allergies  Medications Prior to Admission  Medication Sig Dispense Refill  . Aspirin-Salicylamide-Caffeine (BC HEADACHE) 325-95-16 MG TABS Take 1 packet by mouth daily as needed (for pain).      Marland Kitchen ibuprofen (ADVIL,MOTRIN) 200 MG tablet Take 200 mg by mouth every 6 (six) hours as needed for pain.        Results for orders placed during the hospital encounter of 06/08/12 (from the past 48 hour(s))  ETHANOL     Status: Abnormal   Collection Time    06/08/12  4:45 PM      Result Value Range   Alcohol, Ethyl (B) 202 (*) 0 - 11 mg/dL   Comment:            LOWEST DETECTABLE LIMIT FOR     SERUM ALCOHOL IS 11 mg/dL     FOR MEDICAL PURPOSES ONLY  POCT I-STAT TROPONIN I     Status: None   Collection Time    06/08/12  4:53 PM      Result Value Range   Troponin i, poc 0.00  0.00 - 0.08 ng/mL   Comment 3            Comment: Due to the release kinetics of cTnI,     a negative result within the first hours     of the onset of symptoms does not rule out     myocardial infarction with certainty.     If myocardial infarction is still suspected,     repeat the test at appropriate intervals.  CBC     Status: Abnormal   Collection Time    06/08/12 10:33 PM      Result Value Range   WBC 7.7  4.0 - 10.5 K/uL   RBC 3.47 (*) 4.22 - 5.81 MIL/uL   Hemoglobin 12.3 (*) 13.0 - 17.0 g/dL   HCT 16.1 (*) 09.6 - 04.5 %   MCV 100.0  78.0 - 100.0 fL   MCH 35.4 (*) 26.0 - 34.0 pg   MCHC 35.4  30.0 - 36.0 g/dL   RDW 40.9 (*) 81.1 - 91.4 %   Platelets 90 (*) 150 - 400 K/uL  PROTIME-INR     Status: Abnormal   Collection Time    06/08/12 10:33 PM      Result Value Range   Prothrombin Time 19.4 (*) 11.6 - 15.2 seconds   INR 1.70 (*) 0.00 - 1.49  APTT     Status: None   Collection Time    06/08/12 10:33 PM      Result Value Range   aPTT 37  24 - 37 seconds   Comment:            IF BASELINE aPTT IS ELEVATED,     SUGGEST PATIENT RISK ASSESSMENT     BE USED TO DETERMINE APPROPRIATE     ANTICOAGULANT THERAPY.  COMPREHENSIVE METABOLIC PANEL  Status: Abnormal   Collection Time    06/08/12 10:33 PM      Result Value Range   Sodium 135  135 - 145 mEq/L   Potassium 3.9  3.5 - 5.1 mEq/L   Chloride 99  96 - 112 mEq/L   CO2 22  19 - 32 mEq/L   Glucose, Bld 92  70 - 99 mg/dL   BUN 5 (*) 6 - 23 mg/dL   Creatinine, Ser 2.13  0.50 - 1.35 mg/dL   Calcium 8.4  8.4 - 08.6 mg/dL   Total Protein 7.4  6.0 - 8.3 g/dL   Albumin 2.7 (*) 3.5 - 5.2 g/dL   AST 70 (*) 0 - 37 U/L   ALT 26  0 - 53 U/L   Alkaline Phosphatase 116  39 - 117 U/L   Total Bilirubin 3.1 (*) 0.3 - 1.2 mg/dL   GFR calc non Af Amer 68 (*) >90 mL/min   GFR calc Af Amer 79 (*) >90 mL/min   Comment:            The eGFR has been calculated     using the CKD EPI equation.     This calculation has not been     validated in all clinical     situations.     eGFR's persistently     <90 mL/min signify     possible Chronic Kidney Disease.  TYPE AND SCREEN     Status: None   Collection Time    06/08/12 10:33 PM      Result Value Range   ABO/RH(D) A POS     Antibody Screen NEG     Sample Expiration 06/11/2012    BASIC METABOLIC PANEL     Status: Abnormal   Collection Time    06/09/12  6:01 AM      Result Value Range   Sodium 134 (*) 135 - 145 mEq/L   Potassium 4.6  3.5 - 5.1 mEq/L   Chloride 102  96 - 112 mEq/L   CO2 24  19 - 32 mEq/L   Glucose, Bld 100 (*) 70 - 99 mg/dL    BUN 6  6 - 23 mg/dL   Creatinine, Ser 5.78  0.50 - 1.35 mg/dL   Calcium 7.6 (*) 8.4 - 10.5 mg/dL   GFR calc non Af Amer 79 (*) >90 mL/min   GFR calc Af Amer >90  >90 mL/min   Comment:            The eGFR has been calculated     using the CKD EPI equation.     This calculation has not been     validated in all clinical     situations.     eGFR's persistently     <90 mL/min signify     possible Chronic Kidney Disease.  ETHANOL     Status: Abnormal   Collection Time    06/09/12  6:01 AM      Result Value Range   Alcohol, Ethyl (B) 73 (*) 0 - 11 mg/dL   Comment:            LOWEST DETECTABLE LIMIT FOR     SERUM ALCOHOL IS 11 mg/dL     FOR MEDICAL PURPOSES ONLY   Dg Chest 1 View  06/08/2012  *RADIOLOGY REPORT*  Clinical Data: Fall  CHEST - 1 VIEW  Comparison: 02/27/2004  Findings: The cardiomediastinal silhouette is within normal limits. Lungs are clear.  No pneumothorax.  No definite acute bony deformity.  Chronic left lateral basal rib deformity with healing is present.  IMPRESSION: No active cardiopulmonary disease.   Original Report Authenticated By: Jolaine Click, M.D.    Dg Tibia/fibula Right  06/08/2012  *RADIOLOGY REPORT*  Clinical Data: Injury  RIGHT TIBIA AND FIBULA - 2 VIEW  Comparison: None.  Findings: The proximal tibia and fibula are imaged.  No evidence of acute fracture or dislocation involving the proximal or mid tibia or fibula.  IMPRESSION: No evidence of acute bony injury in the proximal or mid tibia and fibula.  The ankle fracture dislocation is discussed in the ankle series.   Original Report Authenticated By: Jolaine Click, M.D.    Dg Ankle 2 Views Right  06/08/2012  *RADIOLOGY REPORT*  Clinical Data: 72 year old male status post reduction right ankle fracture.  RIGHT ANKLE - 2 VIEW  Comparison: 1748 hours the same day.  Findings: AP and cross-table lateral views with splint material in place.  Mortise joint alignment improved on the lateral view, posterior subluxation  remains.  AP view alignment not significantly changed.  No new injury identified.  IMPRESSION: Improved mortise joint alignment on the lateral view with residual posterior subluxation.  Alignment on the AP view not significantly changed.   Original Report Authenticated By: Erskine Speed, M.D.    Dg Ankle Complete Right  06/08/2012  *RADIOLOGY REPORT*  Clinical Data: Ankle injury  RIGHT ANKLE - COMPLETE 3+ VIEW  Comparison: None.  Findings: There is a fracture of the distal fibula beginning at the tibial plafond and extending superiorly and posteriorly.  Fracture of the medial malleolus occurs at the tibial plafond with displacement. There is near complete anterior dislocation of the tibial plafond with respect to the talar dome.  Talus and calcaneus are intact.  IMPRESSION: Ankle fracture and dislocation as described above involving the medial and lateral malleoli.   Original Report Authenticated By: Jolaine Click, M.D.     Review of Systems  Constitutional: Negative.   HENT: Negative.   Eyes: Negative.   Respiratory: Negative.   Cardiovascular: Negative.   Gastrointestinal: Negative.   Genitourinary: Negative.   Musculoskeletal: Negative.   Skin: Negative.   Neurological: Negative.   Endo/Heme/Allergies: Negative.   Psychiatric/Behavioral: Negative.     Blood pressure 132/62, pulse 107, temperature 98.9 F (37.2 C), temperature source Oral, resp. rate 19, height 5\' 9"  (1.753 m), weight 158 lb 8 oz (71.895 kg), SpO2 97.00%. Physical Exam  Constitutional: He is oriented to person, place, and time. He appears well-developed and well-nourished.  HENT:  Head: Normocephalic.  Eyes: Pupils are equal, round, and reactive to light.  Neck: Normal range of motion.  Cardiovascular: Normal rate and intact distal pulses.   Respiratory: Effort normal.  GI: Soft.  Musculoskeletal:       Right shoulder: Normal.       Left shoulder: Normal.       Right elbow: Normal.      Left elbow: Normal.        Right wrist: Normal.       Left wrist: Normal.       Right hip: Normal.       Left hip: Normal.       Right knee: Normal.       Left knee: Normal.       Left ankle: Normal.       Feet:  Lymphadenopathy:    He has no cervical adenopathy.       Right: No inguinal  adenopathy present.       Left: No inguinal adenopathy present.  Neurological: He is alert and oriented to person, place, and time. He has normal reflexes.  Skin: Skin is warm and dry. No rash noted. No erythema. No pallor.  Psychiatric: He has a normal mood and affect. His behavior is normal. Judgment and thought content normal.     Assessment/Plan Right ankle trimalleolar ankle fracture   OTIF RIGHT ANKLE   HIGH RISK PATIENT FOR INFECTION NON UNION ALCOHOL WITDRAWAL  Fuller Canada 06/09/2012, 6:38 AM

## 2012-06-09 NOTE — Transfer of Care (Signed)
Immediate Anesthesia Transfer of Care Note  Patient: Bradley Beltran.  Procedure(s) Performed: Procedure(s): OPEN REDUCTION INTERNAL FIXATION (ORIF) ANKLE FRACTURE (Right)  Patient Location: PACU  Anesthesia Type:Spinal  Level of Consciousness: awake, alert  and oriented  Airway & Oxygen Therapy: Patient Spontanous Breathing and Patient connected to nasal cannula oxygen  Post-op Assessment: Report given to PACU RN  Post vital signs: Reviewed and stable  Complications: No apparent anesthesia complications

## 2012-06-09 NOTE — Progress Notes (Signed)
CRITICAL VALUE ALERT  Critical value received:  + Staph Aureus (Surgical PCR)  Date of notification:  06/09/12  Time of notification:  0923  Critical value read back:yes  Nurse who received alert:  Dagoberto Ligas, RN  MD notified (1st page):  Romeo Apple  Time of first page:    MD notified (2nd page):  Time of second page:  Responding MD:  Romeo Apple  Time MD responded:    Order placed for Bactroban and CHG bath per protocol.

## 2012-06-09 NOTE — Op Note (Signed)
06/08/2012 - 06/09/2012  9:21 AM  PATIENT:  Bradley Gatling Jr.  71 y.o. male  PRE-OPERATIVE DIAGNOSIS:  trimalleolar right ankle fracture dislocation  POST-OPERATIVE DIAGNOSIS:  trimalleolar right ankle fracture dislocation  PROCEDURE:  Procedure(s): OPEN REDUCTION INTERNAL FIXATION (ORIF) ANKLE FRACTURE (Right)  Implants: Stryker ankle system with 2 medial threaded K wires   Operative findings oblique Weber B. type lateral malleolus fracture with soft bone, oblique medial malleolus fracture starting at the anteromedial joint line and progressing posteriorly  Details of procedure  Site marking and site confirmation were performed in the preop area. The patient was taken to the operating room for spinal anesthetic. After spinal anesthesia he was placed supine in his right leg was prepped and draped sterilely. The tourniquet was applied to the proximal thigh.  After timeout was completed, the tourniquet was elevated to 300 mm of mercury where it stayed for 1 hour. We then made an incision over the fibula creating a full-thickness skin flap. We debrided the fracture site perform manual reduction and held with a bone clamp. Radiographs was taken at that point to confirm reduction of the fracture and the mortise.  We then placed the plate and screw construct over the fibula confirming position with x-ray. We then turned our attention to the medial side where straight incision was made over the fracture full-thickness skin flaps were created. The fracture was opened the joint was irrigated. Inspection of the joint we will no cartilage lesion of the talar dome.  A reduction clamp was used to hold the medial malleolus reduced. 2 K wires were then placed. X-rays confirmed reduction. The K wires were backed out bent and then buried in bone. Repeat radiograph confirmed reduction.  Both wounds were copiously irrigated the medial wound was closed with 3-0 nylon suture the lateral wound was closed with 0  Monocryl and staples.  60 cc of Marcaine injected as a block of the superficial peroneal nerve and the sural nerve and the wound edges  The dressing was placed with Surgicel Xeroform 4 x 4's ABDs.  The tourniquet was released and a posterior splint was applied with the foot held in neutral position    Patient taken to the recovery room stable condition  Plan nonweightbearing 6 weeks  cast 6 weeks then brace 6 weeks.  SURGEON:  Surgeon(s) and Role:    * Stanley E Harrison, MD - Primary  PHYSICIAN ASSISTANT:   ASSISTANTS: none   ANESTHESIA:   spinal  EBL:  Total I/O In: 2000 [I.V.:2000] Out: 50 [Urine:50]  BLOOD ADMINISTERED:none  DRAINS: none   LOCAL MEDICATIONS USED:  MARCAINE   , Amount: 60 ml and OTHER epi  SPECIMEN:  No Specimen  DISPOSITION OF SPECIMEN:  N/A  COUNTS:  YES  TOURNIQUET:   Total Tourniquet Time Documented: Thigh (Right) - 60 minutes Total: Thigh (Right) - 60 minutes   DICTATION: .Dragon Dictation  PLAN OF CARE: Admit to inpatient   PATIENT DISPOSITION:  PACU - hemodynamically stable.   Delay start of Pharmacological VTE agent (>24hrs) due to surgical blood loss or risk of bleeding: yes  

## 2012-06-09 NOTE — Progress Notes (Signed)
UR Chart Review Completed  

## 2012-06-09 NOTE — Anesthesia Preprocedure Evaluation (Signed)
Anesthesia Evaluation  Patient identified by MRN, date of birth, ID band Patient awake    Reviewed: Allergy & Precautions, H&P , NPO status , Patient's Chart, lab work & pertinent test results  Airway Mallampati: III TM Distance: >3 FB     Dental  (+) Teeth Intact, Poor Dentition, Missing and Partial Upper   Pulmonary neg pulmonary ROS,  breath sounds clear to auscultation        Cardiovascular negative cardio ROS  Rhythm:Regular Rate:Normal     Neuro/Psych    GI/Hepatic negative GI ROS, (+)     substance abuse  alcohol use,   Endo/Other    Renal/GU      Musculoskeletal   Abdominal   Peds  Hematology   Anesthesia Other Findings   Reproductive/Obstetrics                           Anesthesia Physical Anesthesia Plan  ASA: III  Anesthesia Plan: Spinal   Post-op Pain Management:    Induction:   Airway Management Planned: Nasal Cannula  Additional Equipment:   Intra-op Plan:   Post-operative Plan:   Informed Consent: I have reviewed the patients History and Physical, chart, labs and discussed the procedure including the risks, benefits and alternatives for the proposed anesthesia with the patient or authorized representative who has indicated his/her understanding and acceptance.     Plan Discussed with:   Anesthesia Plan Comments:         Anesthesia Quick Evaluation

## 2012-06-10 LAB — CBC
HCT: 26.8 % — ABNORMAL LOW (ref 39.0–52.0)
MCV: 101.1 fL — ABNORMAL HIGH (ref 78.0–100.0)
RBC: 2.65 MIL/uL — ABNORMAL LOW (ref 4.22–5.81)
RDW: 18.5 % — ABNORMAL HIGH (ref 11.5–15.5)
WBC: 4.8 10*3/uL (ref 4.0–10.5)

## 2012-06-10 LAB — BASIC METABOLIC PANEL
BUN: 9 mg/dL (ref 6–23)
CO2: 27 mEq/L (ref 19–32)
Chloride: 106 mEq/L (ref 96–112)
Creatinine, Ser: 0.97 mg/dL (ref 0.50–1.35)
GFR calc Af Amer: >90 mL/min (ref >90)
Potassium: 3.8 mEq/L (ref 3.5–5.1)

## 2012-06-10 NOTE — Progress Notes (Signed)
Physical Therapy Treatment Patient Details Name: Bradley Beltran. MRN: [id number] DOB: 09-06-1940 Today's Date: 06/10/2012 Time: 4782-9562 PT Time Calculation (min): 32 min  PT Assessment / Plan / Recommendation Comments on Treatment Session  Gait with walker, NWB R, remains very unstable.  UEs are too weak to carry his body weight and he can only walk a short distance with assist to maintain stability.  He tends to fall backward.but does not seem to sense his peril.  He was instructed in sait on 2 steps using a walker but needed max assist.  He flatly refuses to go to SNF, so I have recommended  that when he returns home he have 2 strong men to help him get into the house.  He will definately need a w/c with elevating leg rests and a BSC (he does not think they already have one).  He will also need HHPT to work on gait stability.    Follow Up Recommendations  Home health PT     Does the patient have the potential to tolerate intense rehabilitation     Barriers to Discharge        Equipment Recommendations  Wheelchair (measurements PT);Other (comment) (BSC)    Recommendations for Other Services    Frequency     Plan Discharge plan needs to be updated;Frequency remains appropriate    Precautions / Restrictions     Pertinent Vitals/Pain     Mobility  Bed Mobility Sit to Supine: 6: Modified independent (Device/Increase time) Transfers Sit to Stand: 4: Min guard;With upper extremity assist Stand to Sit: 4: Min assist Details for Transfer Assistance: better balance with positioning himself to sit Ambulation/Gait Ambulation/Gait Assistance: 3: Mod assist Ambulation Distance (Feet): 15 Feet Assistive device: Rolling walker Gait Pattern: Decreased step length - left;Decreased hip/knee flexion - left General Gait Details: no improvement in gait pattern...gait is still very unstable Stairs: Yes Stairs Assistance: 2: Max assist Stairs Assistance Details (indicate cue type and  reason): 2 Stair Management Technique: Backwards;With walker Number of Stairs: 2 Wheelchair Mobility Wheelchair Mobility: No    Exercises     PT Diagnosis:    PT Problem List:   PT Treatment Interventions:     PT Goals Acute Rehab PT Goals PT Goal Formulation: With patient/family Time For Goal Achievement: 06/17/12 Potential to Achieve Goals: Good Pt will go Stand to Sit: with min assist;with upper extremity assist PT Goal: Stand to Sit - Progress: Met Pt will Ambulate: with min assist;1 - 15 feet;with rolling walker PT Goal: Ambulate - Progress: Progressing toward goal Pt will Go Up / Down Stairs: 1-2 stairs;with rolling walker;with mod assist PT Goal: Up/Down Stairs - Progress: Progressing toward goal  Visit Information  Last PT Received On: 06/10/12    Subjective Data  Subjective: I'm not going to go to rehab   Cognition       Balance     End of Session PT - End of Session Equipment Utilized During Treatment: Gait belt Activity Tolerance: Patient limited by fatigue Patient left: in bed;with call bell/phone within reach;with bed alarm set   GP     Myrlene Broker L 06/10/2012, 1:51 PM

## 2012-06-10 NOTE — Evaluation (Signed)
Physical Therapy Evaluation Patient Details Name: Bradley Beltran. MRN: [id number] DOB: 11-29-1940 Today's Date: 06/10/2012 Time: 4098-1191 PT Time Calculation (min): 34 min  PT Assessment / Plan / Recommendation Clinical Impression  Pt is very pleasant and cooperative with excellent pain control.  He has an OTIF of the R ankle and is in a R short leg cast, NWB R.  He is married and lives in a mobile home with 2 steps (no handrail) to enter.  Bed mobility and sit to stand required only SBA, but he needed mod assist for gait with walker (15') and stand to sit due to decreased strength and balance.  I am concerned about his safety at home until he becomes more stable with a walker and am recommending SNF or CIR at d/c.  He very much wants to return home.    PT Assessment  Patient needs continued PT services    Follow Up Recommendations  CIR;SNF    Does the patient have the potential to tolerate intense rehabilitation      Barriers to Discharge Inaccessible home environment 2 steps into home with no railing    Equipment Recommendations  Wheelchair (measurements PT) (with elevating leg rests)    Recommendations for Other Services Rehab consult   Frequency 7X/week    Precautions / Restrictions Precautions Precautions: Fall Restrictions Weight Bearing Restrictions: Yes RLE Weight Bearing: Non weight bearing   Pertinent Vitals/Pain       Mobility  Bed Mobility Bed Mobility: Supine to Sit;Sit to Supine Supine to Sit: 6: Modified independent (Device/Increase time) Sit to Supine: Not Tested (comment) Transfers Transfers: Sit to Stand;Stand to Sit Sit to Stand: 4: Min guard;With upper extremity assist;From bed Stand to Sit: 3: Mod assist;With upper extremity assist;To chair/3-in-1 Details for Transfer Assistance: Pt has difficulty stepping back to chair in order to sit... cues were given for leaning forward into the walker, but he was unable to achieve this and ended up falling  backward Ambulation/Gait Ambulation/Gait Assistance: 3: Mod assist Ambulation Distance (Feet): 15 Feet Assistive device: Rolling walker Gait Pattern: Decreased step length - left;Decreased hip/knee flexion - left;Step-to pattern Gait velocity: very slow and labored General Gait Details: pt was unable to lift LLE up enough in order to take a complete step...instead, he takes 2-3 little "hops" in order to advance LLE...Marland Kitchenhe was able to maintain true NWB on RLE Stairs: No Wheelchair Mobility Wheelchair Mobility: No    Exercises     PT Diagnosis: Difficulty walking;Generalized weakness;Acute pain  PT Problem List: Decreased strength;Decreased activity tolerance;Decreased balance;Decreased mobility;Decreased cognition;Decreased knowledge of use of DME;Decreased knowledge of precautions;Pain PT Treatment Interventions: DME instruction;Gait training;Stair training;Functional mobility training;Patient/family education   PT Goals Acute Rehab PT Goals PT Goal Formulation: With patient/family Time For Goal Achievement: 06/17/12 Potential to Achieve Goals: Good Pt will go Stand to Sit: with min assist;with upper extremity assist PT Goal: Stand to Sit - Progress: Goal set today Pt will Ambulate: with min assist;1 - 15 feet;with rolling walker PT Goal: Ambulate - Progress: Goal set today Pt will Go Up / Down Stairs: 1-2 stairs;with rolling walker;with mod assist PT Goal: Up/Down Stairs - Progress: Goal set today  Visit Information  Last PT Received On: 06/10/12 Assistance Needed: +1 PT/OT Co-Evaluation/Treatment: Yes    Subjective Data  Subjective: I really want to go home Patient Stated Goal: return home   Prior Functioning  Home Living Lives With: Spouse Available Help at Discharge: Family;Available 24 hours/day Type of Home: Mobile home Home Access:  Stairs to enter [employer] of Steps: 2 Entrance Stairs-Rails: None Home Layout: One level Bathroom Shower/Tub: Teacher, music: Standard Bathroom Accessibility: Yes How Accessible: Accessible via walker Home Adaptive Equipment: Walker - rolling;Walker - standard Additional Comments: Able to borrow shower chair from family member. Prior Function Level of Independence: Independent Able to Take Stairs?: Yes Driving: Yes Vocation: Retired Musician: No difficulties Dominant Hand: Right    Cognition  Cognition Overall Cognitive Status: Appears within functional limits for tasks assessed/performed Arousal/Alertness: Awake/alert Orientation Level: Appears intact for tasks assessed Behavior During Session: [facility] for tasks performed    Extremity/Trunk Assessment Right Lower Extremity Assessment RLE ROM/Strength/Tone: Within functional levels (R ankle is in short leg cast) Left Lower Extremity Assessment LLE ROM/Strength/Tone: Within functional levels LLE Sensation: WFL - Light Touch LLE Coordination: WFL - gross motor Trunk Assessment Trunk Assessment: Normal   Balance Balance Balance Assessed: Yes Static Standing Balance Static Standing - Balance Support: Bilateral upper extremity supported (NWB RLE) Static Standing - Level of Assistance: 5: Stand by assistance  End of Session PT - End of Session Equipment Utilized During Treatment: Gait belt Activity Tolerance: Patient tolerated treatment well Patient left: in chair;with call bell/phone within reach;with family/visitor present  GP     Myrlene Broker L 06/10/2012, 9:58 AM

## 2012-06-10 NOTE — Progress Notes (Signed)
Foley catheter removed without difficulty, urine output 350 ml clear yellow urine.

## 2012-06-10 NOTE — Progress Notes (Signed)
Subjective: 1 Day Post-Op Procedure(s) (LRB): OPEN REDUCTION INTERNAL FIXATION (ORIF) ANKLE FRACTURE (Right) Patient reports pain as moderate.    Objective: Vital signs in last 24 hours: Temp:  [97.7 F (36.5 C)-98.8 F (37.1 C)] 98.3 F (36.8 C) (02/20 0520) Pulse Rate:  [80-106] 80 (02/20 0520) Resp:  [10-20] 20 (02/20 0520) BP: (81-149)/(54-91) 149/91 mmHg (02/20 0520) SpO2:  [91 %-100 %] 94 % (02/20 0520)  Intake/Output from previous day: 02/19 0701 - 02/20 0700 In: 3290 [P.O.:240; I.V.:2600; IV Piggyback:450] Out: 975 [Urine:975] Intake/Output this shift:     Recent Labs  06/08/12 2233 06/09/12 1059 06/10/12 0533  HGB 12.3* 10.0* 9.4*    Recent Labs  06/09/12 1059 06/10/12 0533  WBC 7.5 4.8  RBC 2.83* 2.65*  HCT 28.3* 26.8*  PLT 69* 50*    Recent Labs  06/09/12 0601 06/10/12 0533  NA 134* 137  K 4.6 3.8  CL 102 106  CO2 24 27  BUN 6 9  CREATININE 0.99 0.97  GLUCOSE 100* 90  CALCIUM 7.6* 7.7*    Recent Labs  06/08/12 2233  INR 1.70*    Neurologically intact Neurovascular intact Sensation intact distally Intact pulses distally  Assessment/Plan: 1 Day Post-Op Procedure(s) (LRB): OPEN REDUCTION INTERNAL FIXATION (ORIF) ANKLE FRACTURE (Right) Advance diet Up with therapy D/C IV fluids  Fuller Canada 06/10/2012, 7:27 AM

## 2012-06-10 NOTE — Addendum Note (Signed)
Addendum created 06/10/12 1002 by Moshe Salisbury, CRNA   Modules edited: Anesthesia Medication Administration

## 2012-06-10 NOTE — Evaluation (Signed)
Occupational Therapy Evaluation Patient Details Name: Zaydyn Havey. MRN: [id number] DOB: [birth year] Today's Date: 06/10/2012 Time: 4098-1191 OT Time Calculation (min): 34 min  OT Assessment / Plan / Recommendation Clinical Impression  Patient is a 72 y/o male s/p right ankle ORIF presenting to acute OT with deficits below. Patient will benefit from OT services to increase ADL performance, functional transfers, and BUE strenghth and endurance. Patient would really benefit from SNF at D/C to work on NWB gait. patient is initially against it but will think about it.    OT Assessment  Patient needs continued OT Services    Follow Up Recommendations  SNF    Barriers to Discharge None          Frequency  Min 2X/week    Precautions / Restrictions Precautions Precautions: Fall Restrictions Weight Bearing Restrictions: Yes RLE Weight Bearing: Non weight bearing   Pertinent Vitals/Pain 2/10 pain in right ankle.    ADL  Lower Body Dressing: Performed;Set up Where Assessed - Lower Body Dressing: Unsupported sitting Toilet Transfer: Performed;Minimal assistance Toilet Transfer Method: Sit to stand Toilet Transfer Equipment:  (to recliner) Transfers/Ambulation Related to ADLs: Patient transfer at a Min Assist level with walker    OT Diagnosis: Generalized weakness  OT Problem List: Decreased strength;Decreased activity tolerance;Impaired balance (sitting and/or standing);Decreased knowledge of use of DME or AE;Pain OT Treatment Interventions: Self-care/ADL training;Therapeutic activities;Therapeutic exercise;DME and/or AE instruction;Patient/family education;Balance training   OT Goals Acute Rehab OT Goals OT Goal Formulation: With patient Time For Goal Achievement: 06/24/12 Potential to Achieve Goals: Good ADL Goals Pt Will Perform Grooming: with supervision;Standing at sink ADL Goal: Grooming - Progress: Goal set today Pt Will Transfer to Toilet: with supervision;Regular  height toilet ADL Goal: Toilet Transfer - Progress: Goal set today Pt Will Perform Toileting - Clothing Manipulation: with supervision ADL Goal: Toileting - Clothing Manipulation - Progress: Goal set today Pt Will Perform Toileting - Hygiene: with supervision ADL Goal: Toileting - Hygiene - Progress: Goal set today Arm Goals Pt Will Complete Theraband Exer: with supervision, verbal cues required/provided;Bilateral upper extremities Miscellaneous OT Goals Miscellaneous OT Goal #1: Patient perform core strengthening exercises to increase standing balance ablitiy during daily tasks. OT Goal: Miscellaneous Goal #1 - Progress: Goal set today  Visit Information  Last OT Received On: 06/10/12 Assistance Needed: +1 PT/OT Co-Evaluation/Treatment: Yes    Subjective Data      Prior Functioning     Home Living Lives With: Spouse Available Help at Discharge: Family;Available 24 hours/day Type of Home: Mobile home Home Access: Stairs to enter Entrance Stairs-Number of Steps: 2 Entrance Stairs-Rails: None Home Layout: One level Bathroom Shower/Tub: Engineer, manufacturing systems: Standard Bathroom Accessibility: Yes How Accessible: Accessible via walker Home Adaptive Equipment: Walker - rolling;Walker - standard Additional Comments: Able to borrow shower chair from family member. Prior Function Level of Independence: Independent Able to Take Stairs?: Yes Driving: Yes Vocation: Retired Musician: No difficulties Dominant Hand: Right            Cognition  Cognition Overall Cognitive Status: Appears within functional limits for tasks assessed/performed Arousal/Alertness: Awake/alert Orientation Level: Appears intact for tasks assessed Behavior During Session: [facility] for tasks performed    Extremity/Trunk Assessment Right Upper Extremity Assessment RUE ROM/Strength/Tone: Within functional levels Left Upper Extremity Assessment LUE ROM/Strength/Tone: Within  functional levels Right Lower Extremity Assessment RLE ROM/Strength/Tone: Within functional levels (R ankle is in short leg cast) Left Lower Extremity Assessment LLE ROM/Strength/Tone: Within functional levels LLE Sensation: WFL - Light  Touch LLE Coordination: WFL - gross motor Trunk Assessment Trunk Assessment: Normal     Mobility Bed Mobility Bed Mobility: Supine to Sit;Sit to Supine Supine to Sit: 6: Modified independent (Device/Increase time) Sit to Supine: Not Tested (comment) Transfers Sit to Stand: 4: Min guard;With upper extremity assist;From bed Stand to Sit: 3: Mod assist;With upper extremity assist;To chair/3-in-1 Details for Transfer Assistance: Pt has difficulty stepping back to chair in order to sit... cues were given for leaning forward into the walker, but he was unable to achieve this and ended up falling backward        Balance Balance Balance Assessed: Yes Static Standing Balance Static Standing - Balance Support: Bilateral upper extremity supported (NWB RLE) Static Standing - Level of Assistance: 5: Stand by assistance   End of Session OT - End of Session Equipment Utilized During Treatment: Gait belt;Other (comment) (rolling walker) Activity Tolerance: Patient tolerated treatment well Patient left: in chair;with call bell/phone within reach;with family/visitor present    Limmie Patricia, OTR/L  06/10/2012, 10:14 AM

## 2012-06-11 LAB — CBC
HCT: 26.9 % — ABNORMAL LOW (ref 39.0–52.0)
Hemoglobin: 9.5 g/dL — ABNORMAL LOW (ref 13.0–17.0)
MCHC: 35.3 g/dL (ref 30.0–36.0)
MCV: 101.1 fL — ABNORMAL HIGH (ref 78.0–100.0)
WBC: 6.8 10*3/uL (ref 4.0–10.5)

## 2012-06-11 LAB — BASIC METABOLIC PANEL
BUN: 9 mg/dL (ref 6–23)
Chloride: 102 mEq/L (ref 96–112)
Creatinine, Ser: 0.9 mg/dL (ref 0.50–1.35)
Glucose, Bld: 97 mg/dL (ref 70–99)
Potassium: 4 mEq/L (ref 3.5–5.1)

## 2012-06-11 MED ORDER — HYDROCODONE-ACETAMINOPHEN 7.5-325 MG PO TABS: 1.0000 | ORAL_TABLET | ORAL | Status: DC | PRN

## 2012-06-11 NOTE — Discharge Summary (Signed)
Physician Discharge Summary  Patient ID: Bradley Beltran. MRN: [id number] DOB/AGE: 1940/07/23 72 y.o.  Admit date: 06/08/2012 Discharge date: 06/11/2012  Admission Diagnoses: clsoed trimalleolar fracture right ankle   Discharge Diagnoses: same Active Problems:   Fracture of ankle, trimalleolar, right, closed   Discharged Condition: stable  Hospital Course: normal unremarkable hospital course  Consults: None  Significant Diagnostic Studies: none  Treatments: surgery: OTIF RIGHT ANKLE STRYKER IMPLANTS   Discharge Exam: Blood pressure 155/80, pulse 112, temperature 98.5 F (36.9 C), temperature source Oral, resp. rate 20, height 5\' 9"  (1.753 m), weight 71.668 kg (158 lb), SpO2 90.00%. General appearance: alert, cooperative and no distress NORMAL COLOR AND CAPILLARY REFILL WITH NORMAL SENSATION RIGHT FOOR MILD SWELLING   Disposition:   Discharge Orders   Future Orders Complete By Expires     Diet - low sodium heart healthy  As directed     Increase activity slowly  As directed     Leave dressing on - Keep it clean, dry, and intact until clinic visit  As directed     Other Restrictions  As directed     Comments:      Non weight bearing        Medication List    STOP taking these medications       BC HEADACHE 325-95-16 MG Tabs  Generic drug:  Aspirin-Salicylamide-Caffeine     ibuprofen 200 MG tablet  Commonly known as:  ADVIL,MOTRIN      TAKE these medications       HYDROcodone-acetaminophen 7.5-325 MG per tablet  Commonly known as:  NORCO  Take 1 tablet by mouth every 4 (four) hours as needed for pain.           Follow-up Information   Follow up with Fuller Canada, MD On 06/16/2012. (splint change )    Contact information:   [address] [place] [address] [phone]      SPLINT CHANGE AT OFFICE VISIT   Signed: Fuller Canada 06/11/2012, 9:00 AM

## 2012-06-11 NOTE — Clinical Social Work Note (Signed)
CSW noted consult in EPIC, reviewed record and spoke w RN CM.  Per RN CM, patient is not desiring SNF and will go home at discharge.  No SW needs identified, CSW signing off, will stand by if further SW needs arise prior to discharge.  Santa Genera, LCSW Clinical Social Worker [phone])

## 2012-06-11 NOTE — Progress Notes (Signed)
Physical Therapy Treatment Patient Details Name: Bradley Beltran. MRN: [id number] DOB: 11-01-1940 Today's Date: 06/11/2012 Time: 1478-2956 PT Time Calculation (min): 36 min  PT Assessment / Plan / Recommendation Comments on Treatment Session  Pt was seen prior to d/c for transfer and gait training.  He was instructed in pivot transfer bed to chair and had no diffficulty with this.  Gait with a walker is more steady today, but he still tends to lean backward at times and needs stabilization.  He and wife received a lot of instruction as to transfer safety and to not try to walk at home.  He will be safest with depending on the w/c  and not trying to walk at home just yet/  Apparently, the MD has not ordered HHPT at this point.  The patient and wife understand this.        Follow Up Recommendations        Does the patient have the potential to tolerate intense rehabilitation     Barriers to Discharge        Equipment Recommendations   [facility])    Recommendations for Other Services    Frequency     Plan All goals met and education completed, patient dischaged from PT services    Precautions / Restrictions Restrictions Weight Bearing Restrictions: Yes RLE Weight Bearing: Non weight bearing   Pertinent Vitals/Pain     Mobility  Bed Mobility Supine to Sit: 6: Modified independent (Device/Increase time) Sit to Supine: 6: Modified independent (Device/Increase time) Transfers Transfers: Squat Pivot Transfers Sit to Stand: 6: Modified independent (Device/Increase time) Stand to Sit: 5: Supervision Squat Pivot Transfers: 6: Modified independent (Device/Increase time) Details for Transfer Assistance: very fatigued from walking...sat prematurely and was not correctly positioned to the chair Ambulation/Gait Ambulation/Gait Assistance: 3: Mod assist Ambulation Distance (Feet): 20 Feet Assistive device: Rolling walker Gait Pattern: Decreased hip/knee flexion - left;Decreased step length  - left General Gait Details: gait slightly more stable but still tends to lean backward at times and needs stabilization by therapist    Exercises     PT Diagnosis:    PT Problem List:   PT Treatment Interventions:     PT Goals Acute Rehab PT Goals PT Goal: Stand to Sit - Progress: Met PT Goal: Ambulate - Progress: Partly met PT Goal: Up/Down Stairs - Progress: Not met  Visit Information  Last PT Received On: 06/11/12    Subjective Data  Subjective: I;m feeling better   Cognition       Balance     End of Session PT - End of Session Equipment Utilized During Treatment: Gait belt Activity Tolerance: Patient limited by fatigue Patient left: in chair;with call bell/phone within reach;with family/visitor present Nurse Communication: Mobility status   GP     Myrlene Broker L 06/11/2012, 10:20 AM

## 2012-06-15 ENCOUNTER — Encounter (HOSPITAL_COMMUNITY): Payer: Self-pay | Admitting: Orthopedic Surgery

## 2012-06-16 ENCOUNTER — Ambulatory Visit (INDEPENDENT_AMBULATORY_CARE_PROVIDER_SITE_OTHER): Payer: Medicare Other | Admitting: Orthopedic Surgery

## 2012-06-16 VITALS — BP 120/58 | Ht 68.0 in | Wt 145.0 lb

## 2012-06-16 DIAGNOSIS — S8290XD Unspecified fracture of unspecified lower leg, subsequent encounter for closed fracture with routine healing: Secondary | ICD-10-CM

## 2012-06-16 DIAGNOSIS — S82851D Displaced trimalleolar fracture of right lower leg, subsequent encounter for closed fracture with routine healing: Secondary | ICD-10-CM

## 2012-06-16 NOTE — Progress Notes (Signed)
Patient ID: Bradley Beltran., male   DOB: Sep 02, 1940, 72 y.o.   MRN: [id number] Chief Complaint  Patient presents with  . Follow-up    Post op 1 Right ankle fracture DOS 06/09/12    BP 120/58  Ht 5\' 8"  (1.727 m)  Wt 145 lb (65.772 kg)  BMI 22.05 kg/m2  Fracture of ankle, trimalleolar, right, closed, with routine healing, subsequent encounter   The splint was removed there is a blister on the posterior medial aspect of the ankle. The medial suture line looks relatively good the lateral suture line looks great minimal swelling good neurovascular function  Redress with Xeroform gauze 4 x 4's and a posterior splint come back in a week sutures out x-rays release blisters still present try to place a short leg cast

## 2012-06-16 NOTE — Patient Instructions (Addendum)
Ice and  elevate.

## 2012-06-23 ENCOUNTER — Ambulatory Visit (INDEPENDENT_AMBULATORY_CARE_PROVIDER_SITE_OTHER): Payer: Medicare Other

## 2012-06-23 ENCOUNTER — Ambulatory Visit (INDEPENDENT_AMBULATORY_CARE_PROVIDER_SITE_OTHER): Payer: Medicare Other | Admitting: Orthopedic Surgery

## 2012-06-23 VITALS — BP 122/58 | Ht 68.0 in | Wt 148.0 lb

## 2012-06-23 DIAGNOSIS — S8290XD Unspecified fracture of unspecified lower leg, subsequent encounter for closed fracture with routine healing: Secondary | ICD-10-CM

## 2012-06-23 DIAGNOSIS — S82891D Other fracture of right lower leg, subsequent encounter for closed fracture with routine healing: Secondary | ICD-10-CM

## 2012-06-23 NOTE — Progress Notes (Signed)
Patient ID: Bradley Thrun., male   DOB: [birth year], 72 y.o.   MRN: [id number] Chief Complaint  Patient presents with  . Follow-up    Followup after ankle fracture on February 19, RIGHT ankle x-ray today    History status post-open treatment internal fixation, RIGHT ankle with medial and lateral fixation.  We removed his sutures today. Suture line looks good. He has some mild swelling in his foot and ankle. His pain is decreased. The swelling overall is gone down.  His x-ray shows intact hardware with a reduced mortise.  Recommend short leg cast.  Follow up in 4 weeks for x-ray out of plaster and possible brace

## 2012-06-23 NOTE — Patient Instructions (Signed)
NO WEIGHT BEARING X 4 WEEKS    

## 2012-07-22 ENCOUNTER — Ambulatory Visit (INDEPENDENT_AMBULATORY_CARE_PROVIDER_SITE_OTHER): Payer: Medicare Other

## 2012-07-22 ENCOUNTER — Encounter: Payer: Self-pay | Admitting: Orthopedic Surgery

## 2012-07-22 ENCOUNTER — Ambulatory Visit (INDEPENDENT_AMBULATORY_CARE_PROVIDER_SITE_OTHER): Payer: Medicare Other | Admitting: Orthopedic Surgery

## 2012-07-22 VITALS — BP 106/70 | Ht 68.0 in | Wt 148.0 lb

## 2012-07-22 DIAGNOSIS — S8290XD Unspecified fracture of unspecified lower leg, subsequent encounter for closed fracture with routine healing: Secondary | ICD-10-CM

## 2012-07-22 DIAGNOSIS — S82891D Other fracture of right lower leg, subsequent encounter for closed fracture with routine healing: Secondary | ICD-10-CM

## 2012-07-22 NOTE — Patient Instructions (Addendum)
Cam walker weight bearing as tolerated

## 2012-07-22 NOTE — Progress Notes (Signed)
Patient ID: Bradley Plain., male   DOB: Jul 31, 1940, 72 y.o.   MRN: [id number] Chief Complaint  Patient presents with  . Follow-up    4 week recheck on right ankle fracture with xray OOP. DOI 06-09-12.    Postop 6 weeks status post bimalleolar ankle fracture fixation  X-rays today  Skin incisions are healed  Ankle foot plantigrade  X-ray show: HEALING FRACTURE RIGHT ANKLE WITH INTERNAL FIXATION   CAM WALKER WBAT

## 2012-09-02 ENCOUNTER — Ambulatory Visit (INDEPENDENT_AMBULATORY_CARE_PROVIDER_SITE_OTHER): Payer: Medicare Other

## 2012-09-02 ENCOUNTER — Ambulatory Visit (INDEPENDENT_AMBULATORY_CARE_PROVIDER_SITE_OTHER): Payer: Medicare Other | Admitting: Orthopedic Surgery

## 2012-09-02 ENCOUNTER — Telehealth: Payer: Self-pay | Admitting: Orthopedic Surgery

## 2012-09-02 ENCOUNTER — Encounter: Payer: Self-pay | Admitting: Orthopedic Surgery

## 2012-09-02 VITALS — BP 110/58 | Ht 68.0 in | Wt 148.0 lb

## 2012-09-02 DIAGNOSIS — S82891D Other fracture of right lower leg, subsequent encounter for closed fracture with routine healing: Secondary | ICD-10-CM

## 2012-09-02 DIAGNOSIS — S8290XD Unspecified fracture of unspecified lower leg, subsequent encounter for closed fracture with routine healing: Secondary | ICD-10-CM

## 2012-09-02 NOTE — Patient Instructions (Signed)
Activities as tolerated  Ted hose wear x 3 months

## 2012-09-02 NOTE — Telephone Encounter (Signed)
Advised thigh highs are ok

## 2012-09-02 NOTE — Progress Notes (Signed)
Patient ID: Bradley Winebarger., male   DOB: 02-25-1941, 72 y.o.   MRN: [id number] Chief Complaint  Patient presents with  . Follow-up    Recheck right ankle fracture DOI 06/09/12   Followup 3 months bimalleolar ankle fracture x-rays today main complaint of bilateral leg edema no history of blood pressure to problems and no history of heart disease no shortness of breath  Ankle looks good motion looks good wounds look good he has bilateral pitting ankle edema  Recommend TED hose all see him again in 3 months related to his ankle any other problems he should see his family physician.  xrays look normal see report

## 2012-09-03 ENCOUNTER — Encounter (HOSPITAL_COMMUNITY): Payer: Self-pay | Admitting: Emergency Medicine

## 2012-09-03 ENCOUNTER — Emergency Department (HOSPITAL_COMMUNITY)
Admission: EM | Admit: 2012-09-03 | Discharge: 2012-09-03 | Disposition: A | Discharge status: Home / Self Care | Payer: Medicare Other | Attending: Emergency Medicine | Admitting: Emergency Medicine

## 2012-09-03 ENCOUNTER — Emergency Department (HOSPITAL_COMMUNITY): Payer: Medicare Other

## 2012-09-03 DIAGNOSIS — D689 Coagulation defect, unspecified: Secondary | ICD-10-CM

## 2012-09-03 DIAGNOSIS — M7989 Other specified soft tissue disorders: Secondary | ICD-10-CM | POA: Insufficient documentation

## 2012-09-03 DIAGNOSIS — R609 Edema, unspecified: Secondary | ICD-10-CM | POA: Insufficient documentation

## 2012-09-03 DIAGNOSIS — K7460 Unspecified cirrhosis of liver: Secondary | ICD-10-CM

## 2012-09-03 DIAGNOSIS — R601 Generalized edema: Secondary | ICD-10-CM

## 2012-09-03 DIAGNOSIS — R791 Abnormal coagulation profile: Secondary | ICD-10-CM | POA: Insufficient documentation

## 2012-09-03 DIAGNOSIS — R42 Dizziness and giddiness: Secondary | ICD-10-CM | POA: Insufficient documentation

## 2012-09-03 DIAGNOSIS — D696 Thrombocytopenia, unspecified: Secondary | ICD-10-CM

## 2012-09-03 LAB — URINALYSIS, ROUTINE W REFLEX MICROSCOPIC
Ketones, ur: NEGATIVE mg/dL
Leukocytes, UA: NEGATIVE
Nitrite: NEGATIVE
Protein, ur: NEGATIVE mg/dL
pH: 6 (ref 5.0–8.0)

## 2012-09-03 LAB — COMPREHENSIVE METABOLIC PANEL
Albumin: 2.5 g/dL — ABNORMAL LOW (ref 3.5–5.2)
BUN: 5 mg/dL — ABNORMAL LOW (ref 6–23)
Chloride: 100 mEq/L (ref 96–112)
Creatinine, Ser: 0.9 mg/dL (ref 0.50–1.35)
Total Bilirubin: 4 mg/dL — ABNORMAL HIGH (ref 0.3–1.2)

## 2012-09-03 LAB — CBC WITH DIFFERENTIAL/PLATELET
Basophils Relative: 1 % (ref 0–1)
Eosinophils Absolute: 0.2 10*3/uL (ref 0.0–0.7)
HCT: 32.4 % — ABNORMAL LOW (ref 39.0–52.0)
Hemoglobin: 11.2 g/dL — ABNORMAL LOW (ref 13.0–17.0)
MCH: 36.4 pg — ABNORMAL HIGH (ref 26.0–34.0)
MCHC: 34.6 g/dL (ref 30.0–36.0)
MCV: 105.2 fL — ABNORMAL HIGH (ref 78.0–100.0)
Monocytes Absolute: 0.8 10*3/uL (ref 0.1–1.0)
Monocytes Relative: 13 % — ABNORMAL HIGH (ref 3–12)

## 2012-09-03 LAB — PRO B NATRIURETIC PEPTIDE: Pro B Natriuretic peptide (BNP): 554.8 pg/mL — ABNORMAL HIGH (ref 0–125)

## 2012-09-03 MED ORDER — SPIRONOLACTONE 25 MG PO TABS: 25.0000 mg | ORAL_TABLET | Freq: Two times a day (BID) | ORAL | Status: DC

## 2012-09-03 MED ORDER — FUROSEMIDE 10 MG/ML IJ SOLN
40.0000 mg | Freq: Once | INTRAMUSCULAR | Status: AC
  Administered 2012-09-03: 40 mg via INTRAMUSCULAR
  Filled 2012-09-03: qty 4

## 2012-09-03 MED ORDER — PHYTONADIONE 5 MG PO TABS
10.0000 mg | ORAL_TABLET | Freq: Once | ORAL | Status: AC
  Administered 2012-09-03: 10 mg via ORAL
  Filled 2012-09-03: qty 2

## 2012-09-03 MED ORDER — FUROSEMIDE 40 MG PO TABS: 40.0000 mg | ORAL_TABLET | Freq: Every day | ORAL | Status: DC

## 2012-09-03 NOTE — ED Notes (Signed)
Told pt that we needed a urine sample, pt reports unable to go at this time.

## 2012-09-03 NOTE — ED Notes (Signed)
Pt sent from urgent care for increased abd swelling, dizziness and legs swelling.

## 2012-09-03 NOTE — ED Provider Notes (Addendum)
History  This chart was scribed for Dione Booze, MD, by Concha Se, ED Scribe and Bennett Scrape, ED Scribe. The patient was seen in room [facility] and the patient's care was started at 10:54 AM   CSN: [id number]  Arrival date & time 09/03/12  4132   Chief Complaint  Patient presents with  . Abdominal Pain  . Leg Swelling     The history is provided by the patient. No language interpreter was used.    HPI Comments: Bradley Beltran is a 72 y.o. male who presents to the Emergency Department complaining of 2 weeks of gradual onset, gradually worsening, constant bilateral leg swelling with associated abdominal swelling. He also reports dizziness with bending over since the onset. He denies having any modifying factors and denies having any prior episodes. He was seen at the UC from the same today and sent here for further evaluation. He also denies SOB, nausea, emesis, chest pain, and weakness. Pt denies having a h/o cardiac, pulmonary or nephrology problems. Pt is not a current smoker.  No PCP  History reviewed. No pertinent past medical history.  Past Surgical History  Procedure Laterality Date  . Orif ankle fracture Right 06/09/2012    Procedure: OPEN REDUCTION INTERNAL FIXATION (ORIF) ANKLE FRACTURE;  Surgeon: Vickki Hearing, MD;  Location: [facility]S;  Service: Orthopedics;  Laterality: Right;    History reviewed. No pertinent family history.  History  Substance Use Topics  . Smoking status: Never Smoker   . Smokeless tobacco: Not on file  . Alcohol Use: Yes     Comment: 4 or 5 beers a day      Review of Systems  Respiratory: Negative for shortness of breath.   Gastrointestinal: Negative for nausea and vomiting.  Neurological: Positive for dizziness.    Allergies  Review of patient's allergies indicates no known allergies.  Home Medications   Current Outpatient Rx  Name  Route  Sig  Dispense  Refill  . HYDROcodone-acetaminophen (NORCO) 7.5-325 MG per  tablet   Oral   Take 1 tablet by mouth every 4 (four) hours as needed for pain.   60 tablet   2   . meclizine (ANTIVERT) 25 MG tablet   Oral   Take 25 mg by mouth daily as needed for dizziness.           BP 133/89  Pulse 106  Temp(Src) 97.4 F (36.3 C) (Oral)  Resp 20  Ht 5\' 8"  (1.727 m)  Wt 150 lb (68.04 kg)  BMI 22.81 kg/m2  SpO2 97%  Physical Exam  Nursing note and vitals reviewed. Constitutional: He is oriented to person, place, and time. He appears well-developed and well-nourished. No distress.  HENT:  Head: Normocephalic and atraumatic.  Eyes: EOM are normal.  Neck: Neck supple. JVD present. No tracheal deviation present.  Cardiovascular: Normal rate.   Pulmonary/Chest: Effort normal. No respiratory distress.  Decreased breath sounds at both bases.   Abdominal:  Abdomen is protuberant with a moderate amount of ascites and a positive fluid wave.   Musculoskeletal: Normal range of motion. He exhibits edema.  3+ pedial and pretibal edema. 2+ presacral edema   Neurological: He is alert and oriented to person, place, and time.  Skin: Skin is warm and dry.  Psychiatric: He has a normal mood and affect. His behavior is normal.    ED Course  Procedures (including critical care time)  DIAGNOSTIC STUDIES: Oxygen Saturation is 97% on room air, normal by my interpretation.  COORDINATION OF CARE: 10:55 AM-Discussed treatment plan with pt at bedside and pt agreed to plan.   Results for orders placed during the hospital encounter of 09/03/12  CBC WITH DIFFERENTIAL      Result Value Range   WBC 6.2  4.0 - 10.5 K/uL   RBC 3.08 (*) 4.22 - 5.81 MIL/uL   Hemoglobin 11.2 (*) 13.0 - 17.0 g/dL   HCT 13.0 (*) 86.5 - 78.4 %   MCV 105.2 (*) 78.0 - 100.0 fL   MCH 36.4 (*) 26.0 - 34.0 pg   MCHC 34.6  30.0 - 36.0 g/dL   RDW 69.6 (*) 29.5 - 28.4 %   Platelets 93 (*) 150 - 400 K/uL   Neutrophils Relative % 62  43 - 77 %   Neutro Abs 3.8  1.7 - 7.7 K/uL   Lymphocytes  Relative 22  12 - 46 %   Lymphs Abs 1.4  0.7 - 4.0 K/uL   Monocytes Relative 13 (*) 3 - 12 %   Monocytes Absolute 0.8  0.1 - 1.0 K/uL   Eosinophils Relative 3  0 - 5 %   Eosinophils Absolute 0.2  0.0 - 0.7 K/uL   Basophils Relative 1  0 - 1 %   Basophils Absolute 0.0  0.0 - 0.1 K/uL  COMPREHENSIVE METABOLIC PANEL      Result Value Range   Sodium 134 (*) 135 - 145 mEq/L   Potassium 4.0  3.5 - 5.1 mEq/L   Chloride 100  96 - 112 mEq/L   CO2 21  19 - 32 mEq/L   Glucose, Bld 111 (*) 70 - 99 mg/dL   BUN 5 (*) 6 - 23 mg/dL   Creatinine, Ser 1.32  0.50 - 1.35 mg/dL   Calcium 8.3 (*) 8.4 - 10.5 mg/dL   Total Protein 7.2  6.0 - 8.3 g/dL   Albumin 2.5 (*) 3.5 - 5.2 g/dL   AST 68 (*) 0 - 37 U/L   ALT 23  0 - 53 U/L   Alkaline Phosphatase 96  39 - 117 U/L   Total Bilirubin 4.0 (*) 0.3 - 1.2 mg/dL   GFR calc non Af Amer 82 (*) >90 mL/min   GFR calc Af Amer >90  >90 mL/min  PRO B NATRIURETIC PEPTIDE      Result Value Range   Pro B Natriuretic peptide (BNP) 554.8 (*) 0 - 125 pg/mL  URINALYSIS, ROUTINE W REFLEX MICROSCOPIC      Result Value Range   Color, Urine GREEN (*) YELLOW   APPearance CLEAR  CLEAR   Specific Gravity, Urine 1.025  1.005 - 1.030   pH 6.0  5.0 - 8.0   Glucose, UA NEGATIVE  NEGATIVE mg/dL   Hgb urine dipstick NEGATIVE  NEGATIVE   Bilirubin Urine SMALL (*) NEGATIVE   Ketones, ur NEGATIVE  NEGATIVE mg/dL   Protein, ur NEGATIVE  NEGATIVE mg/dL   Urobilinogen, UA 1.0  0.0 - 1.0 mg/dL   Nitrite NEGATIVE  NEGATIVE   Leukocytes, UA NEGATIVE  NEGATIVE  PROTIME-INR      Result Value Range   Prothrombin Time 32.5 (*) 11.6 - 15.2 seconds   INR 3.41 (*) 0.00 - 1.49   Dg Chest 2 View  09/03/2012   *RADIOLOGY REPORT*  Clinical Data: Leg swelling  CHEST - 2 VIEW  Comparison: 06/08/2012  Findings: Hypoventilation with bibasilar volume loss and atelectasis.  Small pleural effusions bilaterally.  Negative for vascular congestion or edema.  Negative for pneumonia.  IMPRESSION:  Bibasilar atelectasis and small bilateral pleural effusions. Negative for heart failure.   Original Report Authenticated By: Janeece Riggers, M.D.    Date: 09/03/2012  Rate: 101  Rhythm: sinus tachycardia  QRS Axis: normal  Intervals: normal  ST/T Wave abnormalities: normal  Conduction Disutrbances:none  Narrative Interpretation: Sinus tachycardia, low voltage. When compared with ECG of 06/08/2012, heart rate is slower, and voltage has decreased.  Old EKG Reviewed: changes noted    1. Anasarca   2. Cirrhosis of liver   3. Thrombocytopenia   4. Coagulopathy       MDM  Anasarca presumably secondary to cirrhosis. Screening labs have been obtained.  Workup is consistent with liver cirrhosis. He is noted to have macrocytosis as well as thrombocytopenia and coagulopathy. She's given a dose of furosemide in the ED and has started a good diuresis. He is given a dose of vitamin K. He is encouraged to stop drinking. At this point, I feel that he can safely be sent home to continue diuresis as an outpatient. He is to return should symptoms worsen. He is discharged with prescriptions for furosemide and spironolactone.   I personally performed the services described in this documentation, which was scribed in my presence. The recorded information has been reviewed and is accurate.    Dione Booze, MD 09/03/12 1515  Dione Booze, MD 09/03/12 [phone]

## 2012-09-06 ENCOUNTER — Encounter (INDEPENDENT_AMBULATORY_CARE_PROVIDER_SITE_OTHER): Payer: Self-pay | Admitting: Internal Medicine

## 2012-09-06 ENCOUNTER — Ambulatory Visit (INDEPENDENT_AMBULATORY_CARE_PROVIDER_SITE_OTHER): Payer: Medicare Other | Admitting: Internal Medicine

## 2012-09-06 ENCOUNTER — Telehealth (INDEPENDENT_AMBULATORY_CARE_PROVIDER_SITE_OTHER): Payer: Self-pay | Admitting: *Deleted

## 2012-09-06 VITALS — BP 136/64 | HR 100 | Ht 68.0 in | Wt 161.8 lb

## 2012-09-06 DIAGNOSIS — D696 Thrombocytopenia, unspecified: Secondary | ICD-10-CM

## 2012-09-06 DIAGNOSIS — R17 Unspecified jaundice: Secondary | ICD-10-CM | POA: Insufficient documentation

## 2012-09-06 DIAGNOSIS — K7030 Alcoholic cirrhosis of liver without ascites: Secondary | ICD-10-CM

## 2012-09-06 DIAGNOSIS — R188 Other ascites: Secondary | ICD-10-CM

## 2012-09-06 NOTE — Patient Instructions (Addendum)
Restrict NA. No etoh. Conitinue present medications. OV in 2 weeks with a Cmet.

## 2012-09-06 NOTE — Progress Notes (Addendum)
Subjective:     Patient ID: Bradley Beltran, male   DOB: 1940-09-02, 72 y.o.   MRN: [id number]  HPI Referred to our office by the ED at [facility] Friday for anasarca. The swelling started about 2 weeks ago. Swelling to his legs and stomach. Denies prior hx of edema. He had rt ankle surgery 06/10/2011 for an  ankle fx by Dr. Romeo Apple. He tells me his lower legs were swollen then. His appetite is not good. He is SOB. He tells me his breast are sore and he has tenderness in his abdomen.  He usually has a BM about one a day. No melena or bright red rectal bleeding. There has been no fever. No tattoos. No IV Drugs. PT/INR 32.5 and 3.41. He received Vitamin K in the ED. His wife tells me he would drink about 10 beers a day. He has been drinking for over 41 yrs per wife. Weight in February 58. Today his weight 161.8     Component Value Date/Time   WBC 6.2 09/03/2012 1119   RBC 3.08* 09/03/2012 1119   HGB 11.2* 09/03/2012 1119   HCT 32.4* 09/03/2012 1119   PLT 93* 09/03/2012 1119   MCV 105.2* 09/03/2012 1119   MCH 36.4* 09/03/2012 1119   MCHC 34.6 09/03/2012 1119   RDW 20.1* 09/03/2012 1119   LYMPHSABS 1.4 09/03/2012 1119   MONOABS 0.8 09/03/2012 1119   EOSABS 0.2 09/03/2012 1119   BASOSABS 0.0 09/03/2012 1119   CMP     Component Value Date/Time   NA 134* 09/03/2012 1119   K 4.0 09/03/2012 1119   CL 100 09/03/2012 1119   CO2 21 09/03/2012 1119   GLUCOSE 111* 09/03/2012 1119   BUN 5* 09/03/2012 1119   CREATININE 0.90 09/03/2012 1119   CALCIUM 8.3* 09/03/2012 1119   PROT 7.2 09/03/2012 1119   ALBUMIN 2.5* 09/03/2012 1119   AST 68* 09/03/2012 1119   ALT 23 09/03/2012 1119   ALKPHOS 96 09/03/2012 1119   BILITOT 4.0* 09/03/2012 1119   GFRNONAA 82* 09/03/2012 1119   GFRAA >90 09/03/2012 1119   INR/Prothrombin Time   Review of Systems see hpi Current Outpatient Prescriptions  Medication Sig Dispense Refill  . furosemide (LASIX) 40 MG tablet Take 1 tablet (40 mg total) by mouth daily.  30 tablet  0  .  HYDROcodone-acetaminophen (NORCO) 7.5-325 MG per tablet Take 1 tablet by mouth every 4 (four) hours as needed for pain.  60 tablet  2  . meclizine (ANTIVERT) 25 MG tablet Take 25 mg by mouth daily as needed for dizziness.      Marland Kitchen spironolactone (ALDACTONE) 25 MG tablet Take 1 tablet (25 mg total) by mouth 2 (two) times daily.  60 tablet  0   No current facility-administered medications for this visit.   No Known Allergies History reviewed. No pertinent past medical history. No Known Allergies      Objective:   Physical Exam  Filed Vitals:   09/06/12 1400  BP: 136/64  Pulse: 100  Height: 5\' 8"  (1.727 m)  Weight: 161 lb 12.8 oz (73.392 kg)   Alert and oriented. Skin warm and dry. Oral mucosa is moist.   . Sclera icteric, conjunctivae is pink. Thyroid not enlarged. No cervical lymphadenopathy. Wheezes noted rt lung Heart regular rate and rhythm.  Abdomen is not tense. Distended. Bowel sounds are positive. No hepatomegaly. No abdominal masses felt. No tenderness. 2-3+ edema to lower extremities. No asterix. Patient appears jaundice.     Assessment:  Thrombocytopenia. Elevated bilirubin. 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 I discussed thiw case with Dr. Karilyn Cota.    Plan:    CT abdomen/pelvis with CM, PT/INR, AFP, ammonia.  Daily weights, OV in 1 months. Continue the Spironolactone and Lasix. Low NA diet.

## 2012-09-06 NOTE — Telephone Encounter (Signed)
.  Per [name] lab work to be done prior to office visit 09-21-12.

## 2012-09-07 LAB — CBC WITH DIFFERENTIAL/PLATELET
Basophils Absolute: 0 10*3/uL (ref 0.0–0.1)
Basophils Relative: 0 % (ref 0–1)
Eosinophils Absolute: 0.1 10*3/uL (ref 0.0–0.7)
Eosinophils Relative: 2 % (ref 0–5)
HCT: 32.3 % — ABNORMAL LOW (ref 39.0–52.0)
MCH: 35.1 pg — ABNORMAL HIGH (ref 26.0–34.0)
MCHC: 35.6 g/dL (ref 30.0–36.0)
MCV: 98.5 fL (ref 78.0–100.0)
Monocytes Absolute: 0.7 10*3/uL (ref 0.1–1.0)
RDW: 19.5 % — ABNORMAL HIGH (ref 11.5–15.5)

## 2012-09-07 LAB — PROTIME-INR
INR: 2.83 — ABNORMAL HIGH (ref <1.50)
Prothrombin Time: 28.5 seconds — ABNORMAL HIGH (ref 11.6–15.2)

## 2012-09-07 LAB — COMPREHENSIVE METABOLIC PANEL
AST: 71 U/L — ABNORMAL HIGH (ref 0–37)
Alkaline Phosphatase: 94 U/L (ref 39–117)
BUN: 6 mg/dL (ref 6–23)
Creat: 0.92 mg/dL (ref 0.50–1.35)

## 2012-09-07 LAB — AFP TUMOR MARKER: AFP-Tumor Marker: 6.1 ng/mL (ref 0.0–8.0)

## 2012-09-08 ENCOUNTER — Ambulatory Visit (HOSPITAL_COMMUNITY)
Admission: RE | Admit: 2012-09-08 | Discharge: 2012-09-08 | Disposition: A | Discharge status: Home / Self Care | Payer: Medicare Other | Source: Ambulatory Visit | Attending: Internal Medicine | Admitting: Internal Medicine

## 2012-09-08 ENCOUNTER — Other Ambulatory Visit (INDEPENDENT_AMBULATORY_CARE_PROVIDER_SITE_OTHER): Payer: Self-pay | Admitting: Internal Medicine

## 2012-09-08 ENCOUNTER — Encounter (HOSPITAL_COMMUNITY): Payer: Self-pay

## 2012-09-08 DIAGNOSIS — D696 Thrombocytopenia, unspecified: Secondary | ICD-10-CM

## 2012-09-08 DIAGNOSIS — K7030 Alcoholic cirrhosis of liver without ascites: Secondary | ICD-10-CM

## 2012-09-08 DIAGNOSIS — R188 Other ascites: Secondary | ICD-10-CM

## 2012-09-08 DIAGNOSIS — R17 Unspecified jaundice: Secondary | ICD-10-CM

## 2012-09-08 MED ORDER — IOHEXOL 300 MG/ML  SOLN
100.0000 mL | Freq: Once | INTRAMUSCULAR | Status: AC | PRN
  Administered 2012-09-08: 100 mL via INTRAVENOUS

## 2012-09-09 ENCOUNTER — Telehealth (INDEPENDENT_AMBULATORY_CARE_PROVIDER_SITE_OTHER): Payer: Self-pay | Admitting: *Deleted

## 2012-09-09 ENCOUNTER — Other Ambulatory Visit (INDEPENDENT_AMBULATORY_CARE_PROVIDER_SITE_OTHER): Payer: Self-pay | Admitting: Internal Medicine

## 2012-09-09 ENCOUNTER — Telehealth (INDEPENDENT_AMBULATORY_CARE_PROVIDER_SITE_OTHER): Payer: Self-pay | Admitting: Internal Medicine

## 2012-09-09 MED ORDER — THIAMINE HCL 100 MG PO TABS: 100.0000 mg | ORAL_TABLET | Freq: Every day | ORAL | Status: DC

## 2012-09-09 MED ORDER — FOLIC ACID 1 MG PO TABS: 1.0000 mg | ORAL_TABLET | Freq: Every day | ORAL | Status: DC

## 2012-09-09 MED ORDER — OXYCODONE HCL 5 MG PO TABS: 2.5000 mg | ORAL_TABLET | ORAL | Status: DC | PRN

## 2012-09-09 NOTE — Telephone Encounter (Signed)
Patient's weight on 09/09/12 am was 150 lbs.

## 2012-09-09 NOTE — Telephone Encounter (Signed)
Rx for oxycodone 5mg  #10. 1/2 tab every 4 hrs as needed. Message left that he is not to have any tylenol or NSAIDs.

## 2012-09-14 NOTE — Telephone Encounter (Signed)
This has been noted. Weight today 09/14/2012 144

## 2012-09-16 ENCOUNTER — Encounter (INDEPENDENT_AMBULATORY_CARE_PROVIDER_SITE_OTHER): Payer: Self-pay | Admitting: *Deleted

## 2012-09-16 ENCOUNTER — Other Ambulatory Visit (INDEPENDENT_AMBULATORY_CARE_PROVIDER_SITE_OTHER): Payer: Self-pay | Admitting: *Deleted

## 2012-09-16 DIAGNOSIS — D696 Thrombocytopenia, unspecified: Secondary | ICD-10-CM

## 2012-09-20 LAB — COMPREHENSIVE METABOLIC PANEL
ALT: 26 U/L (ref 0–53)
AST: 57 U/L — ABNORMAL HIGH (ref 0–37)
CO2: 23 mEq/L (ref 19–32)
Calcium: 8.3 mg/dL — ABNORMAL LOW (ref 8.4–10.5)
Chloride: 100 mEq/L (ref 96–112)
Sodium: 134 mEq/L — ABNORMAL LOW (ref 135–145)
Total Bilirubin: 5 mg/dL — ABNORMAL HIGH (ref 0.3–1.2)
Total Protein: 7.3 g/dL (ref 6.0–8.3)

## 2012-09-21 ENCOUNTER — Ambulatory Visit (INDEPENDENT_AMBULATORY_CARE_PROVIDER_SITE_OTHER): Payer: Medicare Other | Admitting: Internal Medicine

## 2012-09-21 ENCOUNTER — Encounter (INDEPENDENT_AMBULATORY_CARE_PROVIDER_SITE_OTHER): Payer: Self-pay | Admitting: Internal Medicine

## 2012-09-21 VITALS — BP 125/60 | HR 96 | Ht 68.0 in | Wt 131.5 lb

## 2012-09-21 DIAGNOSIS — K7030 Alcoholic cirrhosis of liver without ascites: Secondary | ICD-10-CM | POA: Insufficient documentation

## 2012-09-21 MED ORDER — FUROSEMIDE 20 MG PO TABS: 20.0000 mg | ORAL_TABLET | Freq: Two times a day (BID) | ORAL | Status: DC

## 2012-09-21 NOTE — Patient Instructions (Addendum)
Reduce Lasix to 20mg  daily. OV in 1 month with a cmet, PT/INR

## 2012-09-21 NOTE — Progress Notes (Signed)
Subjective:     Patient ID: Bradley Beltran, male   DOB: [birth year], 72 y.o.   MRN: [id number]  HPI Here today for f/u of his alcoholic cirrhosis.  Seen in the ED for anasarca. Referred to our office by the emergency dept.  He tells me he feels good. He has not drank any etoh in over a month. Appetite is not good. His family says he is nibbling. He eats snack. His abdomen is not swollen. No edema to lower extremities. 1st weight her 161. Today his weight is 131.      Last weight 161. 09/06/2012 CT abdomen/pelvis with CM; IMPRESSION:  Cirrhotic shrunken liver without focal mass.  Portal vein and splenic vein are patent.  Spleen does not appear enlarged.  Upper abdominal varices.  Prominent ascites.  Bilateral pleural effusions with basilar atelectasis.  CMP     Component Value Date/Time   NA 134* 09/20/2012 1015   K 3.3* 09/20/2012 1015   CL 100 09/20/2012 1015   CO2 23 09/20/2012 1015   GLUCOSE 125* 09/20/2012 1015   BUN 12 09/20/2012 1015   CREATININE 0.98 09/20/2012 1015   CREATININE 0.90 09/03/2012 1119   CALCIUM 8.3* 09/20/2012 1015   PROT 7.3 09/20/2012 1015   ALBUMIN 2.6* 09/20/2012 1015   AST 57* 09/20/2012 1015   ALT 26 09/20/2012 1015   ALKPHOS 86 09/20/2012 1015   BILITOT 5.0* 09/20/2012 1015   GFRNONAA 82* 09/03/2012 1119   GFRAA >90 09/03/2012 1119    09/06/2012 PT/INR 28.5 and 2.83 AFP 6.0    Review of Systems see hpi Current Outpatient Prescriptions  Medication Sig Dispense Refill  . folic acid (FOLVITE) 1 MG tablet Take 1 tablet (1 mg total) by mouth daily.  30 tablet  6  . furosemide (LASIX) 40 MG tablet Take 1 tablet (40 mg total) by mouth daily.  30 tablet  0  . spironolactone (ALDACTONE) 25 MG tablet Take 1 tablet (25 mg total) by mouth 2 (two) times daily.  60 tablet  0  . thiamine 100 MG tablet Take 1 tablet (100 mg total) by mouth daily.  30 tablet  6  . meclizine (ANTIVERT) 25 MG tablet Take 25 mg by mouth daily as needed for dizziness.       No current  facility-administered medications for this visit.   Past Surgical History  Procedure Laterality Date  . Orif ankle fracture Right 06/09/2012    Procedure: OPEN REDUCTION INTERNAL FIXATION (ORIF) ANKLE FRACTURE;  Surgeon: Vickki Hearing, MD;  Location: [facility];  Service: Orthopedics;  Laterality: Right;  . Neck fx      2005            Objective:   Physical Exam  Filed Vitals:   09/21/12 1026  BP: 125/60  Pulse: 96  Height: 5\' 8"  (1.727 m)  Weight: 131 lb 8 oz (59.648 kg)   Alert and oriented. Skin warm and dry. Oral mucosa is moist.   . Sclera  Slightly icteric, conjunctivae is pink. Thyroid not enlarged. No cervical lymphadenopathy. Lungs clear. Heart regular rate and rhythm.  Abdomen is soft. Bowel sounds are positive. No hepatomegaly. No abdominal masses felt. No tenderness.  No edema to lower extremities.       Assessment:   alcoholic cirrhosis with Ascites. No ascites today. Weight down 30 pounds. He has not drank etoh in over a month. Feels much better. Dr. Karilyn Cota in with patient.     Plan:    Reduce Lasix to  20mg  daily. Continue present medications. OV in 1 month. Cmet,INR with OV next month

## 2012-09-23 ENCOUNTER — Telehealth (INDEPENDENT_AMBULATORY_CARE_PROVIDER_SITE_OTHER): Payer: Self-pay | Admitting: *Deleted

## 2012-09-23 DIAGNOSIS — K7030 Alcoholic cirrhosis of liver without ascites: Secondary | ICD-10-CM

## 2012-09-23 NOTE — Telephone Encounter (Signed)
.  Per Terri Setzer,NP labs in 1 month. 

## 2012-09-28 ENCOUNTER — Other Ambulatory Visit (INDEPENDENT_AMBULATORY_CARE_PROVIDER_SITE_OTHER): Payer: Self-pay | Admitting: Internal Medicine

## 2012-09-28 DIAGNOSIS — K7030 Alcoholic cirrhosis of liver without ascites: Secondary | ICD-10-CM

## 2012-09-28 MED ORDER — OXYCODONE HCL 5 MG PO TABS: 2.5000 mg | ORAL_TABLET | ORAL | Status: DC | PRN

## 2012-09-28 MED ORDER — SPIRONOLACTONE 25 MG PO TABS: 25.0000 mg | ORAL_TABLET | Freq: Every day | ORAL | Status: DC

## 2012-09-28 MED ORDER — SPIRONOLACTONE 25 MG PO TABS: 25.0000 mg | ORAL_TABLET | Freq: Two times a day (BID) | ORAL | Status: DC

## 2012-10-13 ENCOUNTER — Other Ambulatory Visit (INDEPENDENT_AMBULATORY_CARE_PROVIDER_SITE_OTHER): Payer: Self-pay | Admitting: *Deleted

## 2012-10-13 ENCOUNTER — Encounter (INDEPENDENT_AMBULATORY_CARE_PROVIDER_SITE_OTHER): Payer: Self-pay | Admitting: *Deleted

## 2012-10-13 DIAGNOSIS — K7030 Alcoholic cirrhosis of liver without ascites: Secondary | ICD-10-CM

## 2012-10-18 ENCOUNTER — Telehealth (INDEPENDENT_AMBULATORY_CARE_PROVIDER_SITE_OTHER): Payer: Self-pay | Admitting: Internal Medicine

## 2012-10-18 ENCOUNTER — Telehealth (INDEPENDENT_AMBULATORY_CARE_PROVIDER_SITE_OTHER): Payer: Self-pay | Admitting: *Deleted

## 2012-10-18 ENCOUNTER — Other Ambulatory Visit (INDEPENDENT_AMBULATORY_CARE_PROVIDER_SITE_OTHER): Payer: Self-pay | Admitting: Internal Medicine

## 2012-10-18 DIAGNOSIS — K7030 Alcoholic cirrhosis of liver without ascites: Secondary | ICD-10-CM

## 2012-10-18 DIAGNOSIS — K7460 Unspecified cirrhosis of liver: Secondary | ICD-10-CM

## 2012-10-18 DIAGNOSIS — R188 Other ascites: Secondary | ICD-10-CM

## 2012-10-18 LAB — COMPREHENSIVE METABOLIC PANEL
ALT: 23 U/L (ref 0–53)
BUN: 14 mg/dL (ref 6–23)
CO2: 22 mEq/L (ref 19–32)
Calcium: 9.4 mg/dL (ref 8.4–10.5)
Creat: 0.95 mg/dL (ref 0.50–1.35)
Total Bilirubin: 5.3 mg/dL — ABNORMAL HIGH (ref 0.3–1.2)

## 2012-10-18 LAB — AMMONIA: Ammonia: 72 umol/L — ABNORMAL HIGH (ref 11–60)

## 2012-10-18 MED ORDER — LACTULOSE 10 GM/15ML PO SOLN: 20.0000 g | Freq: Three times a day (TID) | ORAL | Status: DC

## 2012-10-18 NOTE — Telephone Encounter (Signed)
Patient will come by office and pick up lab slips. Apparently has been confused over the weekend. Needs an ammonia and Cmet. Lactulose TID e-prescribed to [facility]

## 2012-10-18 NOTE — Telephone Encounter (Signed)
Per Bradley Beltran the patient will need to have Ammonia level on October 25 2012

## 2012-10-18 NOTE — Telephone Encounter (Signed)
Ammonia elevated. Weight is 120. Stop the Lasix. Will add Lactulose TID. Cmet Monday

## 2012-10-25 ENCOUNTER — Other Ambulatory Visit (INDEPENDENT_AMBULATORY_CARE_PROVIDER_SITE_OTHER): Payer: Self-pay | Admitting: Internal Medicine

## 2012-10-25 DIAGNOSIS — K7030 Alcoholic cirrhosis of liver without ascites: Secondary | ICD-10-CM

## 2012-10-25 LAB — AMMONIA: Ammonia: 72 umol/L — ABNORMAL HIGH (ref 11–60)

## 2012-10-25 MED ORDER — LACTULOSE 10 GM/15ML PO SOLN: 10.0000 g | Freq: Three times a day (TID) | ORAL | Status: DC

## 2012-10-27 ENCOUNTER — Telehealth (INDEPENDENT_AMBULATORY_CARE_PROVIDER_SITE_OTHER): Payer: Self-pay | Admitting: *Deleted

## 2012-10-27 ENCOUNTER — Encounter (INDEPENDENT_AMBULATORY_CARE_PROVIDER_SITE_OTHER): Payer: Self-pay | Admitting: Internal Medicine

## 2012-10-27 ENCOUNTER — Ambulatory Visit (INDEPENDENT_AMBULATORY_CARE_PROVIDER_SITE_OTHER): Payer: Medicare Other | Admitting: Internal Medicine

## 2012-10-27 VITALS — BP 106/68 | HR 96 | Temp 98.1°F | Ht 67.0 in | Wt 137.2 lb

## 2012-10-27 DIAGNOSIS — K7030 Alcoholic cirrhosis of liver without ascites: Secondary | ICD-10-CM

## 2012-10-27 DIAGNOSIS — K7460 Unspecified cirrhosis of liver: Secondary | ICD-10-CM

## 2012-10-27 NOTE — Progress Notes (Signed)
Subjective:     Patient ID: Bradley Beltran, male   DOB: [birth year], 72 y.o.   MRN: [id number]  HPIPresents today for f/u of his alcoholic cirrhosis. Seen in the ED in May of this year and diagnosed with alcoholic cirrhosis with ascites. Noted on 10/18/2012 Ammonia 72. I asked patient to start Lactulose three times a day. I advised them to hold the Lasix for now.  His weight also dropped to 120. I advised wife to stop the Lasix for now. Today his weight is 137. He tells me he feels pretty good. He eats 2 meals a day and a few snacks daily.  He is having at least 2 stools a day. Weight in May 161  10/18/2012 Ammonia 72 CMP     Component Value Date/Time   NA 134* 10/18/2012 1210   K 3.8 10/18/2012 1210   CL 97 10/18/2012 1210   CO2 22 10/18/2012 1210   GLUCOSE 95 10/18/2012 1210   BUN 14 10/18/2012 1210   CREATININE 0.95 10/18/2012 1210   CREATININE 0.90 09/03/2012 1119   CALCIUM 9.4 10/18/2012 1210   PROT 8.1 10/18/2012 1210   ALBUMIN 3.1* 10/18/2012 1210   AST 43* 10/18/2012 1210   ALT 23 10/18/2012 1210   ALKPHOS 96 10/18/2012 1210   BILITOT 5.3* 10/18/2012 1210   GFRNONAA 82* 09/03/2012 1119   GFRAA >90 09/03/2012 1119   .  09/06/2012 CT abdomen/pelvis with CM;  IMPRESSION:  Cirrhotic shrunken liver without focal mass.  Portal vein and splenic vein are patent.  Spleen does not appear enlarged.  Upper abdominal varices.  Prominent ascites.  Bilateral pleural effusions with basilar atelectasis.    Review of Systems see hpi Current Outpatient Prescriptions  Medication Sig Dispense Refill  . folic acid (FOLVITE) 1 MG tablet Take 1 tablet (1 mg total) by mouth daily.  30 tablet  6  . lactulose (CHRONULAC) 10 GM/15ML solution Take 15 mLs (10 g total) by mouth 3 (three) times daily.  240 mL  4  . Multiple Vitamins-Minerals (MULTIVITAL) tablet Take 1 tablet by mouth daily.      Marland Kitchen oxyCODONE (ROXICODONE) 5 MG immediate release tablet Take 0.5 tablets (2.5 mg total) by mouth every 4 (four) hours as  needed for pain.  20 tablet  0  . spironolactone (ALDACTONE) 25 MG tablet Take 1 tablet (25 mg total) by mouth 2 (two) times daily.  60 tablet  4  . thiamine 100 MG tablet Take 1 tablet (100 mg total) by mouth daily.  30 tablet  6  . furosemide (LASIX) 20 MG tablet Take 1 tablet (20 mg total) by mouth 2 (two) times daily.  30 tablet  5  . meclizine (ANTIVERT) 25 MG tablet Take 25 mg by mouth daily as needed for dizziness.       No current facility-administered medications for this visit.   Past Medical History  Diagnosis Date  . Cirrhosis with alcoholism    Past Surgical History  Procedure Laterality Date  . Orif ankle fracture Right 06/09/2012    Procedure: OPEN REDUCTION INTERNAL FIXATION (ORIF) ANKLE FRACTURE;  Surgeon: Vickki Hearing, MD;  Location: [facility];  Service: Orthopedics;  Laterality: Right;  . Neck fx      2005  \ No Known Allergies     Objective:   Physical Exam  Filed Vitals:   10/27/12 1046  BP: 106/68  Pulse: 96  Temp: 98.1 F (36.7 C)  Height: 5\' 7"  (1.702 m)  Weight: 137 lb 3.2  oz (62.234 kg)   Alert and oriented. Skin warm and dry. Oral mucosa is moist.   . Sclera icteric.              Thyroid not enlarged. No cervical lymphadenopathy. Lungs clear. Heart regular rate and rhythm.  Abdomen is soft. Bowel sounds are positive. No hepatomegaly. No abdominal masses felt. No tenderness.  No edema to lower extremities.    Assessment:   Alcoholic cirrhosis with hx of ascites. He seems to be doing better. There does not appear to be ascites at this time. Abdomen is soft.  He however continues to icteric.   I discussed with Dr. Karilyn Cota        Plan:    Start Lasix 20mg  M-W-F. OV in 1 month. Continue the Lactulose

## 2012-10-27 NOTE — Patient Instructions (Addendum)
OV in 1 Monday. Lasix 20mg  M-W-F. Cmet and ammonia with visit.

## 2012-10-27 NOTE — Telephone Encounter (Signed)
.  Per [name] patient will need to have lab work in 12 month.

## 2012-11-18 ENCOUNTER — Other Ambulatory Visit (INDEPENDENT_AMBULATORY_CARE_PROVIDER_SITE_OTHER): Payer: Self-pay | Admitting: *Deleted

## 2012-11-18 ENCOUNTER — Encounter (INDEPENDENT_AMBULATORY_CARE_PROVIDER_SITE_OTHER): Payer: Self-pay | Admitting: *Deleted

## 2012-11-18 DIAGNOSIS — K7460 Unspecified cirrhosis of liver: Secondary | ICD-10-CM

## 2012-11-25 ENCOUNTER — Encounter (INDEPENDENT_AMBULATORY_CARE_PROVIDER_SITE_OTHER): Payer: Self-pay | Admitting: Internal Medicine

## 2012-11-25 ENCOUNTER — Telehealth (INDEPENDENT_AMBULATORY_CARE_PROVIDER_SITE_OTHER): Payer: Self-pay | Admitting: *Deleted

## 2012-11-25 ENCOUNTER — Ambulatory Visit (INDEPENDENT_AMBULATORY_CARE_PROVIDER_SITE_OTHER): Payer: Medicare Other | Admitting: Internal Medicine

## 2012-11-25 VITALS — BP 100/70 | HR 88 | Temp 99.1°F | Ht 68.0 in | Wt 133.0 lb

## 2012-11-25 DIAGNOSIS — K7460 Unspecified cirrhosis of liver: Secondary | ICD-10-CM

## 2012-11-25 DIAGNOSIS — K7030 Alcoholic cirrhosis of liver without ascites: Secondary | ICD-10-CM

## 2012-11-25 NOTE — Telephone Encounter (Signed)
.  Per [name] the patient will need to have these labs drawn in 3 months, that have been noted for 02/2013.

## 2012-11-25 NOTE — Patient Instructions (Signed)
CBC, Hepatic profile, PT/INR in 3 months. AFP

## 2012-11-25 NOTE — Progress Notes (Addendum)
Subjective:     Patient ID: Bradley Beltran, male   DOB: [birth year], 72 y.o.   MRN: [id number]  HPI72 yr old white male here today for f/u. Last seen in July, 2014 dor alcoholic cirrhosis.  In May diagnosed with alcoholic cirrhosis with ascites.  Presently taking lacutlose three times a day.  Weight 137. He is taking Lasix M-W-F. Todaty is weight is 133. No edema to lower extremities.. No SOB. His appetite is okay. He eats when he get hungry. No abdominal pain. He c/o fatigue.  No pain when he walks.  He has started mowing his yard with a riding mower. No abdominal pain. BMs  X twice a da. No confusion. He has never undergone a colonoscopy or an EGD for esophageal varices. CMP     Component Value Date/Time   NA 133* 11/25/2012 1440   K 3.9 11/25/2012 1440   CL 100 11/25/2012 1440   CO2 24 11/25/2012 1440   GLUCOSE 119* 11/25/2012 1440   BUN 11 11/25/2012 1440   CREATININE 0.87 11/25/2012 1440   CREATININE 0.90 09/03/2012 1119   CALCIUM 8.4 11/25/2012 1440   PROT 6.7 11/25/2012 1440   ALBUMIN 2.7* 11/25/2012 1440   AST 42* 11/25/2012 1440   ALT 19 11/25/2012 1440   ALKPHOS 84 11/25/2012 1440   BILITOT 5.3* 11/25/2012 1440   GFRNONAA 82* 09/03/2012 1119   GFRAA >90 09/03/2012 1119    11/25/2012 PT/INR21.4 and 1.92   CBC    Component Value Date/Time   WBC 5.8 09/06/2012 1510   RBC 3.28* 09/06/2012 1510   HGB 11.5* 09/06/2012 1510   HCT 32.3* 09/06/2012 1510   PLT 100* 09/06/2012 1510   MCV 98.5 09/06/2012 1510   MCH 35.1* 09/06/2012 1510   MCHC 35.6 09/06/2012 1510   RDW 19.5* 09/06/2012 1510   LYMPHSABS 1.3 09/06/2012 1510   MONOABS 0.7 09/06/2012 1510   EOSABS 0.1 09/06/2012 1510   BASOSABS 0.0 09/06/2012 1510    CMP     Component Value Date/Time   NA 134* 10/18/2012 1210   K 3.8 10/18/2012 1210   CL 97 10/18/2012 1210   CO2 22 10/18/2012 1210   GLUCOSE 95 10/18/2012 1210   BUN 14 10/18/2012 1210   CREATININE 0.95 10/18/2012 1210   CREATININE 0.90 09/03/2012 1119   CALCIUM 9.4 10/18/2012 1210   PROT 8.1  10/18/2012 1210   ALBUMIN 3.1* 10/18/2012 1210   AST 43* 10/18/2012 1210   ALT 23 10/18/2012 1210   ALKPHOS 96 10/18/2012 1210   BILITOT 5.3* 10/18/2012 1210   GFRNONAA 82* 09/03/2012 1119   GFRAA >90 09/03/2012 1119  11/18/2012 Ammonia 56.   09/06/2012 CT abdomen/pelvis with CM: IMPRESSION:  Cirrhotic shrunken liver without focal mass.  Portal vein and splenic vein are patent.  Spleen does not appear enlarged.  Upper abdominal varices.  Prominent ascites.  Bilateral pleural effusions with basilar atelectasis.      Review of Systems see hpi Current Outpatient Prescriptions  Medication Sig Dispense Refill  . folic acid (FOLVITE) 1 MG tablet Take 1 tablet (1 mg total) by mouth daily.  30 tablet  6  . furosemide (LASIX) 20 MG tablet Take 20 mg by mouth 2 (two) times daily. Takes on M-W-F      . meclizine (ANTIVERT) 25 MG tablet Take 25 mg by mouth daily as needed for dizziness.      . Multiple Vitamins-Minerals (MULTIVITAL) tablet Take 1 tablet by mouth daily.      Marland Kitchen oxyCODONE (ROXICODONE)  5 MG immediate release tablet Take 0.5 tablets (2.5 mg total) by mouth every 4 (four) hours as needed for pain.  20 tablet  0  . spironolactone (ALDACTONE) 25 MG tablet Take 1 tablet (25 mg total) by mouth 2 (two) times daily.  60 tablet  4  . thiamine 100 MG tablet Take 1 tablet (100 mg total) by mouth daily.  30 tablet  6  . lactulose (CHRONULAC) 10 GM/15ML solution Take 15 mLs (10 g total) by mouth 3 (three) times daily.  240 mL  4   No current facility-administered medications for this visit.   Past Medical History  Diagnosis Date  . Cirrhosis with alcoholism    Past Surgical History  Procedure Laterality Date  . Orif ankle fracture Right 06/09/2012    Procedure: OPEN REDUCTION INTERNAL FIXATION (ORIF) ANKLE FRACTURE;  Surgeon: Vickki Hearing, MD;  Location: [facility];  Service: Orthopedics;  Laterality: Right;  . Neck fx      2005        Objective:   Physical Exam There were no vitals filed  for this visit. Filed Vitals:   11/25/12 1429  BP: 100/70  Pulse: 88  Temp: 99.1 F (37.3 C)  Height: 5\' 8"  (1.727 m)  Weight: 133 lb (60.328 kg)  Alert and oriented. Skin warm and dry. Oral mucosa is moist.   . Sclera anicteric, conjunctivae is pink. Thyroid not enlarged. No cervical lymphadenopathy. Lungs clear. Heart regular rate and rhythm.  Abdomen is soft. Bowel sounds are positive. No hepatomegaly. No abdominal masses felt. No tenderness.  No edema to lower extremities.        Assessment:   Alcoholic cirrhosis with ascites. No ascites at this time. No lower leg edema. Weight is stable at 133.     Plan:    cbc, PT/INR, Hepatic profile, AFP  for his next visit.  Continue the Laxix M-W-F.  PT/INR, cmet today.  OV in 2 months   We will consider colonoscopy and EGD looking for varices with his next office visit.  May also consider Lopressor 10mg  with next visit.

## 2012-11-26 LAB — COMPREHENSIVE METABOLIC PANEL
AST: 42 U/L — ABNORMAL HIGH (ref 0–37)
Albumin: 2.7 g/dL — ABNORMAL LOW (ref 3.5–5.2)
BUN: 11 mg/dL (ref 6–23)
Calcium: 8.4 mg/dL (ref 8.4–10.5)
Chloride: 100 mEq/L (ref 96–112)
Creat: 0.87 mg/dL (ref 0.50–1.35)
Glucose, Bld: 119 mg/dL — ABNORMAL HIGH (ref 70–99)
Potassium: 3.9 mEq/L (ref 3.5–5.3)

## 2012-11-26 LAB — PROTIME-INR
INR: 1.92 — ABNORMAL HIGH (ref <1.50)
Prothrombin Time: 21.4 seconds — ABNORMAL HIGH (ref 11.6–15.2)

## 2012-11-29 ENCOUNTER — Ambulatory Visit (INDEPENDENT_AMBULATORY_CARE_PROVIDER_SITE_OTHER): Payer: Medicare Other | Admitting: Internal Medicine

## 2012-12-02 ENCOUNTER — Ambulatory Visit (INDEPENDENT_AMBULATORY_CARE_PROVIDER_SITE_OTHER): Payer: Medicare Other | Admitting: Orthopedic Surgery

## 2012-12-02 ENCOUNTER — Encounter: Payer: Self-pay | Admitting: Orthopedic Surgery

## 2012-12-02 VITALS — BP 119/68 | Ht 68.0 in | Wt 135.0 lb

## 2012-12-02 DIAGNOSIS — S82851D Displaced trimalleolar fracture of right lower leg, subsequent encounter for closed fracture with routine healing: Secondary | ICD-10-CM

## 2012-12-02 DIAGNOSIS — IMO0001 Reserved for inherently not codable concepts without codable children: Secondary | ICD-10-CM

## 2012-12-02 NOTE — Progress Notes (Signed)
Subjective:     Patient ID: Bradley Beltran, male   DOB: 03/20/1941, 72 y.o.   MRN: [id number]  Chief Complaint  Patient presents with  . Follow-up    3 month follow up right ankle/Jun 09, 2012     HPI His only real complaint is swelling, he's had some bilateral ankle edema takes a fluid pill this is chronic. His been able to return to his normal activities  Review of Systems     Objective:   Physical Exam BP 119/68  Ht 5\' 8"  (1.727 m)  Wt 135 lb (61.236 kg)  BMI 20.53 kg/m2 General appearance is normal, the patient is alert and oriented x3 with normal mood and affect. He can walk with no assistive devices no noticeable limp he has swelling in both ankles with pitting edema he has excellent excursion of his Achilles tendon is stable ankle joint both incisions healed well    Assessment:     Encounter Diagnosis  Name Primary?  . Fracture of ankle, trimalleolar, right, closed, with routine healing, subsequent encounter Yes        Plan:     Return in 6 months for the one-year annual followup x-ray.

## 2012-12-02 NOTE — Patient Instructions (Addendum)
Return in 6 months

## 2012-12-09 ENCOUNTER — Telehealth (INDEPENDENT_AMBULATORY_CARE_PROVIDER_SITE_OTHER): Payer: Self-pay | Admitting: *Deleted

## 2012-12-09 NOTE — Telephone Encounter (Signed)
Bradley Beltran's weight without any clothes on is 130 lbs. Bradley Beltran's return phone number is [phone].

## 2012-12-09 NOTE — Telephone Encounter (Signed)
Message left at home 

## 2012-12-13 ENCOUNTER — Telehealth (INDEPENDENT_AMBULATORY_CARE_PROVIDER_SITE_OTHER): Payer: Self-pay | Admitting: Internal Medicine

## 2012-12-13 ENCOUNTER — Other Ambulatory Visit (INDEPENDENT_AMBULATORY_CARE_PROVIDER_SITE_OTHER): Payer: Self-pay | Admitting: Internal Medicine

## 2012-12-13 DIAGNOSIS — K709 Alcoholic liver disease, unspecified: Secondary | ICD-10-CM

## 2012-12-13 NOTE — Telephone Encounter (Signed)
Calculated Meld score is 20

## 2012-12-13 NOTE — Telephone Encounter (Signed)
Am going to repeat a CMET and a PT/INR on him

## 2012-12-13 NOTE — Telephone Encounter (Signed)
Am going to get a Cmet and PT/INR on him. Hoperfully his numbers will be better.

## 2012-12-14 LAB — PROTIME-INR
INR: 1.82 — ABNORMAL HIGH (ref <1.50)
Prothrombin Time: 20.6 seconds — ABNORMAL HIGH (ref 11.6–15.2)

## 2012-12-14 LAB — COMPREHENSIVE METABOLIC PANEL
Albumin: 3 g/dL — ABNORMAL LOW (ref 3.5–5.2)
BUN: 11 mg/dL (ref 6–23)
Calcium: 8.3 mg/dL — ABNORMAL LOW (ref 8.4–10.5)
Chloride: 102 mEq/L (ref 96–112)
Creat: 1.09 mg/dL (ref 0.50–1.35)
Glucose, Bld: 139 mg/dL — ABNORMAL HIGH (ref 70–99)
Potassium: 3.7 mEq/L (ref 3.5–5.3)

## 2012-12-22 ENCOUNTER — Telehealth (INDEPENDENT_AMBULATORY_CARE_PROVIDER_SITE_OTHER): Payer: Self-pay | Admitting: Internal Medicine

## 2012-12-22 ENCOUNTER — Telehealth (INDEPENDENT_AMBULATORY_CARE_PROVIDER_SITE_OTHER): Payer: Self-pay | Admitting: *Deleted

## 2012-12-22 DIAGNOSIS — K7030 Alcoholic cirrhosis of liver without ascites: Secondary | ICD-10-CM

## 2012-12-22 NOTE — Telephone Encounter (Signed)
Bradley Beltran, Bradley Beltran has OV in October. Want to be sure we have a CBC, CMET, AFP, PT/INR ordered on him.

## 2012-12-22 NOTE — Telephone Encounter (Signed)
Labs have been ordered and noted to be done on October 03,2014.

## 2012-12-22 NOTE — Telephone Encounter (Signed)
.  Per [name] the patient will need to have labs drawn prior to office visit 01/25/13, labs are noted to be done on 01/21/13.

## 2013-01-19 ENCOUNTER — Other Ambulatory Visit (INDEPENDENT_AMBULATORY_CARE_PROVIDER_SITE_OTHER): Payer: Self-pay | Admitting: *Deleted

## 2013-01-19 ENCOUNTER — Encounter (INDEPENDENT_AMBULATORY_CARE_PROVIDER_SITE_OTHER): Payer: Self-pay | Admitting: *Deleted

## 2013-01-19 DIAGNOSIS — K7030 Alcoholic cirrhosis of liver without ascites: Secondary | ICD-10-CM

## 2013-01-24 LAB — COMPREHENSIVE METABOLIC PANEL
ALT: 20 U/L (ref 0–53)
AST: 41 U/L — ABNORMAL HIGH (ref 0–37)
CO2: 25 mEq/L (ref 19–32)
Calcium: 8.6 mg/dL (ref 8.4–10.5)
Chloride: 103 mEq/L (ref 96–112)
Creat: 1.02 mg/dL (ref 0.50–1.35)
Potassium: 4.2 mEq/L (ref 3.5–5.3)
Sodium: 134 mEq/L — ABNORMAL LOW (ref 135–145)
Total Protein: 6.5 g/dL (ref 6.0–8.3)

## 2013-01-24 LAB — CBC
MCV: 95.3 fL (ref 78.0–100.0)
Platelets: 117 10*3/uL — ABNORMAL LOW (ref 150–400)
RBC: 3.42 MIL/uL — ABNORMAL LOW (ref 4.22–5.81)
RDW: 15.9 % — ABNORMAL HIGH (ref 11.5–15.5)
WBC: 4.8 10*3/uL (ref 4.0–10.5)

## 2013-01-25 ENCOUNTER — Ambulatory Visit (INDEPENDENT_AMBULATORY_CARE_PROVIDER_SITE_OTHER): Payer: Medicare Other | Admitting: Internal Medicine

## 2013-01-25 ENCOUNTER — Encounter (INDEPENDENT_AMBULATORY_CARE_PROVIDER_SITE_OTHER): Payer: Self-pay | Admitting: Internal Medicine

## 2013-01-25 VITALS — BP 122/58 | HR 80 | Temp 97.7°F | Ht 68.0 in | Wt 144.5 lb

## 2013-01-25 DIAGNOSIS — K7030 Alcoholic cirrhosis of liver without ascites: Secondary | ICD-10-CM

## 2013-01-25 LAB — AFP TUMOR MARKER: AFP-Tumor Marker: 5.8 ng/mL (ref 0.0–8.0)

## 2013-01-25 NOTE — Patient Instructions (Addendum)
OV in 3 months with Dr. Karilyn Cota with a CBC and CMET

## 2013-01-25 NOTE — Progress Notes (Addendum)
Subjective:     Patient ID: Bradley Beltran, male   DOB: [birth year], 72 y.o.   MRN: [id number]  HPIHere today for f/u of his alcoholic cirrhosis. In May of this year he was diagnosed with alcoholic cirrhosis with ascites. Referred to our office in May with anasarca from the ED. Presently taking Lactulose thrett times a day.  He tells me he feels good. He has gained about 11 pounds since his lat visit in August. He denies any edema. No abdominal distention. Bilirubin 4.0, Platelet ct 117. Appetite is good. BMs usually twice a day. No melena or bright red rectal bleeding His energy level is much better. He is mowing grass.  He says he is staying active. No etoh since his diagnosis.        CMP     Component Value Date/Time   NA 134* 01/24/2013 1020   K 4.2 01/24/2013 1020   CL 103 01/24/2013 1020   CO2 25 01/24/2013 1020   GLUCOSE 179* 01/24/2013 1020   BUN 12 01/24/2013 1020   CREATININE 1.02 01/24/2013 1020   CREATININE 0.90 09/03/2012 1119   CALCIUM 8.6 01/24/2013 1020   PROT 6.5 01/24/2013 1020   ALBUMIN 3.0* 01/24/2013 1020   AST 41* 01/24/2013 1020   ALT 20 01/24/2013 1020   ALKPHOS 77 01/24/2013 1020   BILITOT 4.0* 01/24/2013 1020   GFRNONAA 82* 09/03/2012 1119   GFRAA >90 09/03/2012 1119  01/24/2013 PT/INR 20.2 and 1.78, AFP 5.8  09/06/2012 CT abdomen/pelvis with CM: IMPRESSION:  Cirrhotic shrunken liver without focal mass.  Portal vein and splenic vein are patent.  Spleen does not appear enlarged.  Upper abdominal varices.  Prominent ascites.  Bilateral pleural effusions with basilar atelectasis.   CBC    Component Value Date/Time   WBC 4.8 01/24/2013 1018   RBC 3.42* 01/24/2013 1018   HGB 11.5* 01/24/2013 1018   HCT 32.6* 01/24/2013 1018   PLT 117* 01/24/2013 1018   MCV 95.3 01/24/2013 1018   MCH 33.6 01/24/2013 1018   MCHC 35.3 01/24/2013 1018   RDW 15.9* 01/24/2013 1018   LYMPHSABS 1.3 09/06/2012 1510   MONOABS 0.7 09/06/2012 1510   EOSABS 0.1 09/06/2012 1510   BASOSABS 0.0  09/06/2012 1510      Review of Systems Current Outpatient Prescriptions  Medication Sig Dispense Refill  . folic acid (FOLVITE) 1 MG tablet Take 1 tablet (1 mg total) by mouth daily.  30 tablet  6  . furosemide (LASIX) 20 MG tablet Take 20 mg by mouth 2 (two) times daily. Takes on M-W-F      . lactulose (CHRONULAC) 10 GM/15ML solution Take 15 mLs (10 g total) by mouth 3 (three) times daily.  240 mL  4  . meclizine (ANTIVERT) 25 MG tablet Take 25 mg by mouth daily as needed for dizziness.      . Multiple Vitamins-Minerals (MULTIVITAL) tablet Take 1 tablet by mouth daily.      Marland Kitchen oxyCODONE (ROXICODONE) 5 MG immediate release tablet Take 0.5 tablets (2.5 mg total) by mouth every 4 (four) hours as needed for pain.  20 tablet  0  . spironolactone (ALDACTONE) 25 MG tablet Take 1 tablet (25 mg total) by mouth 2 (two) times daily.  60 tablet  4  . thiamine 100 MG tablet Take 1 tablet (100 mg total) by mouth daily.  30 tablet  6   No current facility-administered medications for this visit.   Past Medical History  Diagnosis Date  . Cirrhosis with  alcoholism    Past Surgical History  Procedure Laterality Date  . Orif ankle fracture Right 06/09/2012    Procedure: OPEN REDUCTION INTERNAL FIXATION (ORIF) ANKLE FRACTURE;  Surgeon: Vickki Hearing, MD;  Location: [facility];  Service: Orthopedics;  Laterality: Right;  . Neck fx      2005   No Known Allergies      Objective:   Physical Exam  Filed Vitals:   01/25/13 1024  BP: 122/58  Pulse: 80  Temp: 97.7 F (36.5 C)  Height: 5\' 8"  (1.727 m)  Weight: 144 lb 8 oz (65.545 kg)  Alert and oriented. Skin warm and dry. Oral mucosa is moist.   . Sclera anicteric, conjunctivae is pink. Thyroid not enlarged. No cervical lymphadenopathy. Lungs clear. Heart regular rate and rhythm.  Abdomen is soft. Bowel sounds are positive. No hepatomegaly. No abdominal masses felt. No tenderness.  No edema to lower extremities.        Assessment:    Alcoholic  cirrhosis. Says he feels better. He has declined a screening colonoscopy and EGD looking for varices. No etoh since diagnosis.    Thrombocytopenia related to his liver disease. Platelets 3 months ago 100 now 117.  Plan:    OV in 3 months with Dr. Karilyn Cota. CBC, CMET,

## 2013-02-03 ENCOUNTER — Telehealth (INDEPENDENT_AMBULATORY_CARE_PROVIDER_SITE_OTHER): Payer: Self-pay | Admitting: *Deleted

## 2013-02-03 DIAGNOSIS — K7030 Alcoholic cirrhosis of liver without ascites: Secondary | ICD-10-CM

## 2013-02-03 NOTE — Telephone Encounter (Signed)
.  Per [name] the patient will need to have lab work in 3 months , this is noted  For 04/25/13.

## 2013-03-02 ENCOUNTER — Other Ambulatory Visit (INDEPENDENT_AMBULATORY_CARE_PROVIDER_SITE_OTHER): Payer: Self-pay | Admitting: Internal Medicine

## 2013-03-30 ENCOUNTER — Other Ambulatory Visit (INDEPENDENT_AMBULATORY_CARE_PROVIDER_SITE_OTHER): Payer: Self-pay | Admitting: Internal Medicine

## 2013-03-31 ENCOUNTER — Other Ambulatory Visit (INDEPENDENT_AMBULATORY_CARE_PROVIDER_SITE_OTHER): Payer: Self-pay | Admitting: *Deleted

## 2013-03-31 ENCOUNTER — Encounter (INDEPENDENT_AMBULATORY_CARE_PROVIDER_SITE_OTHER): Payer: Self-pay | Admitting: *Deleted

## 2013-03-31 DIAGNOSIS — K7030 Alcoholic cirrhosis of liver without ascites: Secondary | ICD-10-CM

## 2013-04-25 LAB — COMPREHENSIVE METABOLIC PANEL
ALT: 20 U/L (ref 0–53)
AST: 42 U/L — ABNORMAL HIGH (ref 0–37)
Albumin: 3.1 g/dL — ABNORMAL LOW (ref 3.5–5.2)
Alkaline Phosphatase: 77 U/L (ref 39–117)
BUN: 12 mg/dL (ref 6–23)
CO2: 27 mEq/L (ref 19–32)
Calcium: 8.9 mg/dL (ref 8.4–10.5)
Chloride: 100 mEq/L (ref 96–112)
Creat: 1.06 mg/dL (ref 0.50–1.35)
Glucose, Bld: 153 mg/dL — ABNORMAL HIGH (ref 70–99)
Potassium: 4.1 mEq/L (ref 3.5–5.3)
Sodium: 137 mEq/L (ref 135–145)
Total Bilirubin: 4 mg/dL — ABNORMAL HIGH (ref 0.3–1.2)
Total Protein: 6.5 g/dL (ref 6.0–8.3)

## 2013-04-25 LAB — CBC
HCT: 33.7 % — ABNORMAL LOW (ref 39.0–52.0)
Hemoglobin: 11.9 g/dL — ABNORMAL LOW (ref 13.0–17.0)
MCH: 31.7 pg (ref 26.0–34.0)
MCHC: 35.3 g/dL (ref 30.0–36.0)
MCV: 89.9 fL (ref 78.0–100.0)
Platelets: 93 10*3/uL — ABNORMAL LOW (ref 150–400)
RBC: 3.75 MIL/uL — ABNORMAL LOW (ref 4.22–5.81)
RDW: 19.3 % — ABNORMAL HIGH (ref 11.5–15.5)
WBC: 5.1 10*3/uL (ref 4.0–10.5)

## 2013-04-26 ENCOUNTER — Ambulatory Visit (INDEPENDENT_AMBULATORY_CARE_PROVIDER_SITE_OTHER): Payer: Medicare Other | Admitting: Internal Medicine

## 2013-04-26 ENCOUNTER — Encounter (INDEPENDENT_AMBULATORY_CARE_PROVIDER_SITE_OTHER): Payer: Self-pay | Admitting: Internal Medicine

## 2013-04-26 VITALS — BP 140/80 | HR 82 | Temp 97.8°F | Resp 18 | Ht 68.0 in | Wt 150.8 lb

## 2013-04-26 DIAGNOSIS — K7030 Alcoholic cirrhosis of liver without ascites: Secondary | ICD-10-CM

## 2013-04-26 DIAGNOSIS — R188 Other ascites: Secondary | ICD-10-CM

## 2013-04-26 DIAGNOSIS — K7682 Hepatic encephalopathy: Secondary | ICD-10-CM

## 2013-04-26 DIAGNOSIS — K7290 Hepatic failure, unspecified without coma: Secondary | ICD-10-CM

## 2013-04-26 LAB — PROTIME-INR
INR: 1.62 — ABNORMAL HIGH (ref <1.50)
Prothrombin Time: 18.9 seconds — ABNORMAL HIGH (ref 11.6–15.2)

## 2013-04-26 LAB — HEPATITIS A ANTIBODY, TOTAL: Hep A Total Ab: REACTIVE — AB

## 2013-04-26 LAB — HEPATITIS C ANTIBODY: HCV AB: NEGATIVE

## 2013-04-26 LAB — HEPATITIS B SURFACE ANTIGEN: Hepatitis B Surface Ag: NEGATIVE

## 2013-04-26 MED ORDER — SPIRONOLACTONE 25 MG PO TABS: 50.0000 mg | ORAL_TABLET | Freq: Every day | ORAL | Status: DC

## 2013-04-26 MED ORDER — FUROSEMIDE 20 MG PO TABS: 20.0000 mg | ORAL_TABLET | ORAL | Status: DC

## 2013-04-26 NOTE — Patient Instructions (Signed)
Physician will contact you with results of blood work. Abdominal ultrasound to be scheduled.

## 2013-04-26 NOTE — Progress Notes (Addendum)
Presenting complaint;  Followup for decompensated alcoholic liver disease.  Subjective:  Patient is 73 year old Caucasian male who has alcoholic cirrhosis complicated by cholestasis, ascites and hepatic encephalopathy who presents for scheduled visit accompanied by his wife. He was last seen 3 months ago. He has no complaints. He has very good appetite. He drinks 3 cans of Ensure daily. He watches salt intake. He hasn't had any alcohol since May 2014. He has gained 6 pounds since his last visit 3 months ago but he denies abdominal distention lower extremity edema. He has at least 2 bowel movements a day. He denies melena or rectal bleeding. He has not been confused since he has been on lactulose according to his wife.  Current Medications: Current Outpatient Prescriptions  Medication Sig Dispense Refill  . folic acid (FOLVITE) 1 MG tablet TAKE ONE TABLET BY MOUTH ONCE DAILY  100 tablet  3  . furosemide (LASIX) 20 MG tablet TAKE ONE TABLET BY MOUTH TWICE DAILY  60 tablet  0  . lactulose (CHRONULAC) 10 GM/15ML solution Take 15 mLs (10 g total) by mouth 3 (three) times daily.  240 mL  4  . meclizine (ANTIVERT) 25 MG tablet Take 25 mg by mouth daily as needed for dizziness.      . Multiple Vitamins-Minerals (MULTIVITAL) tablet Take 1 tablet by mouth daily.      Marland Kitchen. oxyCODONE (ROXICODONE) 5 MG immediate release tablet Take 0.5 tablets (2.5 mg total) by mouth every 4 (four) hours as needed for pain.  20 tablet  0  . spironolactone (ALDACTONE) 25 MG tablet TAKE ONE TABLET BY MOUTH TWICE DAILY  60 tablet  3  . thiamine 100 MG tablet Take 1 tablet (100 mg total) by mouth daily.  30 tablet  6   No current facility-administered medications for this visit.     Objective: Blood pressure 140/80, pulse 82, temperature 97.8 F (36.6 C), temperature source Oral, resp. rate 18, height 5\' 8"  (1.727 m), weight 150 lb 12.8 oz (68.402 kg). Patient is alert and in no acute distress. Asterixis  absent. Conjunctiva is pink. Sclera does not appear to be icteric Oropharyngeal mucosa is normal. No neck masses or thyromegaly noted. Cardiac exam with regular rhythm normal S1 and S2. No murmur or gallop noted. Lungs are clear to auscultation. Abdomen symmetrical soft and nontender. No organomegaly or masses. Shifting dullness is absent. No LE edema or clubbing noted.  Labs/studies Results:  lab data from 04/25/2013 WBC 5.1, H&H 11.9 and 33.7 and platelet count 93K Serum sodium 137, potassium 4.1, right 100, CO2 27, BUN 12, creatinine 1.06, glucose 153. Bilirubin 4.0, AP 77, AST 42, ALT 20, total protein 6.5 and albumin 3.1. Serum calcium 8.9. AFP on 01/24/2013 was 5.8. Last imaging was abdominal pelvic CT on 09/08/2012 revealing bilateral pleural effusions, ascites, upper abdominal varices and cirrhotic liver.       Assessment:  #1. Alcoholic cirrhosis complicated by ascites, hepatic encephalopathy as well as cholestasis. Bilirubin has not returned to normal. #2. Ascites is well controlled with therapy.  #3. Hepatic encephalopathy. He is doing well with lactulose. #4. Thrombocytopenia secondary to cirrhosis. #5. Anemia most likely chronic disease and anemia. His H&H has gradually improved over the last 8 months. #6. He is average risk for CRC but not interested in screening.  Plan: Patient advised not to take oxycodone unless absolutely needed. Patient will go to lab for hepatitis B surface antigen, HCV and HAV totoal antibody. Will also check INR. Furosemide dose is 20 mg  3 times a week. Spironolactone 50 mg by mouth daily. Upper abdominal ultrasound. Will schedule for EGD and possible banding once above completed. Physical visit in 3 months.

## 2013-04-27 ENCOUNTER — Ambulatory Visit (HOSPITAL_COMMUNITY)
Admission: RE | Admit: 2013-04-27 | Discharge: 2013-04-27 | Disposition: A | Discharge status: Home / Self Care | Payer: Medicare Other | Source: Ambulatory Visit | Attending: Internal Medicine | Admitting: Internal Medicine

## 2013-04-27 DIAGNOSIS — K828 Other specified diseases of gallbladder: Secondary | ICD-10-CM | POA: Insufficient documentation

## 2013-04-27 DIAGNOSIS — K7460 Unspecified cirrhosis of liver: Secondary | ICD-10-CM | POA: Insufficient documentation

## 2013-04-27 DIAGNOSIS — K7030 Alcoholic cirrhosis of liver without ascites: Secondary | ICD-10-CM

## 2013-04-27 DIAGNOSIS — R188 Other ascites: Secondary | ICD-10-CM

## 2013-06-06 ENCOUNTER — Other Ambulatory Visit (INDEPENDENT_AMBULATORY_CARE_PROVIDER_SITE_OTHER): Payer: Self-pay | Admitting: Internal Medicine

## 2013-06-07 ENCOUNTER — Ambulatory Visit: Payer: Medicare Other | Admitting: Orthopedic Surgery

## 2013-07-05 ENCOUNTER — Ambulatory Visit: Payer: Medicare Other | Admitting: Orthopedic Surgery

## 2013-07-14 ENCOUNTER — Ambulatory Visit (INDEPENDENT_AMBULATORY_CARE_PROVIDER_SITE_OTHER): Payer: Medicare Other | Admitting: Orthopedic Surgery

## 2013-07-14 ENCOUNTER — Ambulatory Visit (INDEPENDENT_AMBULATORY_CARE_PROVIDER_SITE_OTHER): Payer: Medicare Other

## 2013-07-14 ENCOUNTER — Encounter: Payer: Self-pay | Admitting: Orthopedic Surgery

## 2013-07-14 VITALS — BP 147/86 | Ht 68.0 in | Wt 150.0 lb

## 2013-07-14 DIAGNOSIS — S82891A Other fracture of right lower leg, initial encounter for closed fracture: Secondary | ICD-10-CM

## 2013-07-14 DIAGNOSIS — S82899A Other fracture of unspecified lower leg, initial encounter for closed fracture: Secondary | ICD-10-CM

## 2013-07-14 NOTE — Progress Notes (Signed)
Patient ID: Bradley DoneJoseph G Borkowski Jr., male   DOB: 1941-04-11, 73 y.o.   MRN: [id number] Chief Complaint  Patient presents with  . Follow-up    Yearly follow up Right ankle  DOS 06/09/12   Status post bimalleolar ankle fracture treated with open treatment internal fixation. No complaints. Doing well. Review of systems negative. Ankle range of motion normal. Stability normal. Incisions healed. Motor exam normal. Gait normal. Mood normal. Appearance normal.  X-rays show fracture healing no complications no arthritis. Mortise intact.  Ankle fracture doing well  Followup as needed.

## 2013-07-14 NOTE — Patient Instructions (Signed)
activities as tolerated 

## 2013-07-25 ENCOUNTER — Ambulatory Visit (INDEPENDENT_AMBULATORY_CARE_PROVIDER_SITE_OTHER): Payer: Medicare Other | Admitting: Internal Medicine

## 2013-07-25 ENCOUNTER — Encounter (INDEPENDENT_AMBULATORY_CARE_PROVIDER_SITE_OTHER): Payer: Self-pay | Admitting: Internal Medicine

## 2013-07-25 VITALS — BP 126/72 | HR 86 | Temp 97.5°F | Resp 18 | Ht 68.0 in | Wt 164.3 lb

## 2013-07-25 DIAGNOSIS — K7290 Hepatic failure, unspecified without coma: Secondary | ICD-10-CM

## 2013-07-25 DIAGNOSIS — R188 Other ascites: Secondary | ICD-10-CM

## 2013-07-25 DIAGNOSIS — K7030 Alcoholic cirrhosis of liver without ascites: Secondary | ICD-10-CM

## 2013-07-25 DIAGNOSIS — K7682 Hepatic encephalopathy: Secondary | ICD-10-CM

## 2013-07-25 LAB — BASIC METABOLIC PANEL
BUN: 10 mg/dL (ref 6–23)
CHLORIDE: 103 meq/L (ref 96–112)
CO2: 24 mEq/L (ref 19–32)
Calcium: 8.6 mg/dL (ref 8.4–10.5)
Creat: 1.13 mg/dL (ref 0.50–1.35)
Glucose, Bld: 155 mg/dL — ABNORMAL HIGH (ref 70–99)
Potassium: 3.8 mEq/L (ref 3.5–5.3)
Sodium: 138 mEq/L (ref 135–145)

## 2013-07-25 LAB — HEPATIC FUNCTION PANEL
ALT: 22 U/L (ref 0–53)
AST: 46 U/L — AB (ref 0–37)
Albumin: 3.3 g/dL — ABNORMAL LOW (ref 3.5–5.2)
Alkaline Phosphatase: 76 U/L (ref 39–117)
BILIRUBIN DIRECT: 1.3 mg/dL — AB (ref 0.0–0.3)
BILIRUBIN TOTAL: 3.8 mg/dL — AB (ref 0.2–1.2)
Indirect Bilirubin: 2.5 mg/dL — ABNORMAL HIGH (ref 0.2–1.2)
Total Protein: 6.5 g/dL (ref 6.0–8.3)

## 2013-07-25 LAB — CBC
HEMATOCRIT: 35.6 % — AB (ref 39.0–52.0)
Hemoglobin: 12.7 g/dL — ABNORMAL LOW (ref 13.0–17.0)
MCH: 33.3 pg (ref 26.0–34.0)
MCHC: 35.7 g/dL (ref 30.0–36.0)
MCV: 93.4 fL (ref 78.0–100.0)
Platelets: 93 10*3/uL — ABNORMAL LOW (ref 150–400)
RBC: 3.81 MIL/uL — ABNORMAL LOW (ref 4.22–5.81)
RDW: 17.5 % — AB (ref 11.5–15.5)
WBC: 6.2 10*3/uL (ref 4.0–10.5)

## 2013-07-25 LAB — AFP TUMOR MARKER: AFP-Tumor Marker: 7.1 ng/mL (ref 0.0–8.0)

## 2013-07-25 LAB — PROTIME-INR
INR: 1.56 — ABNORMAL HIGH (ref <1.50)
PROTHROMBIN TIME: 18.4 s — AB (ref 11.6–15.2)

## 2013-07-25 NOTE — Progress Notes (Signed)
Presenting complaint;  Followup for decompensated alcoholic liver disease.  Subjective:  Bradley Beltran is 73 year old Caucasian male with alcoholic cirrhosis complicated by ascites, hepatic encephalopathy, anemia and thrombocytopenia who presents for scheduled visit accompanied by his wife. He was last seen 3 months ago. Patient has no complaints. He states he feels well. He is quite pleased that he is gaining weight and building muscle mass. He denies abdominal distention or lower extremity edema. He has very good appetite. He has at least 2 bowel movements every day. He denies melena or rectal bleeding. On his last visit he was advised to get hepatitis B vaccination. He is immune to hepatitis A. according to his wife he is not having any more confusion spells. He has not had any alcohol in 11 months.   Current Medications: Outpatient Encounter Prescriptions as of 07/25/2013  Medication Sig  . folic acid (FOLVITE) 1 MG tablet TAKE ONE TABLET BY MOUTH ONCE DAILY  . furosemide (LASIX) 20 MG tablet Take 1 tablet (20 mg total) by mouth every Monday, Wednesday, and Friday.  . lactulose (CHRONULAC) 10 GM/15ML solution TAKE 15 ML (10G) BY MOUTH THREE TIMES DAILY  . meclizine (ANTIVERT) 25 MG tablet Take 25 mg by mouth daily as needed for dizziness.  . Multiple Vitamins-Minerals (MULTIVITAL) tablet Take 1 tablet by mouth daily.  Marland Kitchen. oxyCODONE (ROXICODONE) 5 MG immediate release tablet Take 0.5 tablets (2.5 mg total) by mouth every 4 (four) hours as needed for pain.  Marland Kitchen. spironolactone (ALDACTONE) 25 MG tablet Take 2 tablets (50 mg total) by mouth daily.  Marland Kitchen. thiamine 100 MG tablet Take 1 tablet (100 mg total) by mouth daily.     Objective: Blood pressure 126/72, pulse 86, temperature 97.5 F (36.4 C), temperature source Oral, resp. rate 18, height 5\' 8"  (1.727 m), weight 164 lb 4.8 oz (74.526 kg). Patient is alert and does not have asterixis. Conjunctiva is pink. Sclera today is not icteric Oropharyngeal  mucosa is normal. No neck masses or thyromegaly noted. Cardiac exam with regular rhythm normal S1 and S2. No murmur or gallop noted. Lungs are clear to auscultation. Abdomen is symmetrical. Abdomen is soft without palpable spleen or liver. No tenderness noted. Shifting dullness is absent.  No LE edema or clubbing noted.  Labs/studies Results: AFP was 5.8 on 01/24/2013. INR was 1.6 on 04/25/2013. CBC on 04/25/2013 revealed WBC of 5.1 H&H of 11.9 and 33.7 and platelet count was 93K. Comprehensive chemistry panel pertinent for glucose of 153, bilirubin of 4.0 AST of 42 and ALT of 20 and albumin was 3.1. Last ultrasound was on 05/12/2013 revealing minimal ascites cirrhotic liver without focal abnormalities and normal size spleen.   Assessment:  #1. Alcoholic cirrhosis complicated by ascites hepatic encephalopathy as well as anemia and thrombocytopenia. He has come long way in the time he was acutely ill. He does not appear jaundiced today. Weight gain appears to be due to the reversal of muscle wasting. Clinically he does not have ascites. Patient is fully aware that he must not go back to drinking alcohol ever again.  #2. Anemia secondary to chronic disease.  Plan: Will discontinue folate acid and thiamine. He will talk with Dr. Janna ArchonDiego but getting hepatitis B vaccination. CBC, metabolic 7, LFTs, INR and AFP. Next ultrasound will be due in July 2015. Office visit in 6 months.

## 2013-07-25 NOTE — Patient Instructions (Signed)
Physician will call with results of blood work. Next ultrasound would be in July 2015.

## 2013-08-09 ENCOUNTER — Other Ambulatory Visit (INDEPENDENT_AMBULATORY_CARE_PROVIDER_SITE_OTHER): Payer: Self-pay | Admitting: Internal Medicine

## 2013-08-09 NOTE — Telephone Encounter (Signed)
Per Dr.Rehman may fill with 5 additional refills. 

## 2013-08-10 ENCOUNTER — Other Ambulatory Visit (INDEPENDENT_AMBULATORY_CARE_PROVIDER_SITE_OTHER): Payer: Self-pay | Admitting: Internal Medicine

## 2013-08-10 DIAGNOSIS — K7460 Unspecified cirrhosis of liver: Secondary | ICD-10-CM

## 2013-08-10 MED ORDER — LACTULOSE 10 GM/15ML PO SOLN: 10.0000 g | Freq: Three times a day (TID) | ORAL | Status: DC

## 2013-10-04 ENCOUNTER — Other Ambulatory Visit (INDEPENDENT_AMBULATORY_CARE_PROVIDER_SITE_OTHER): Payer: Self-pay | Admitting: Internal Medicine

## 2013-10-04 DIAGNOSIS — K7460 Unspecified cirrhosis of liver: Secondary | ICD-10-CM

## 2013-10-25 ENCOUNTER — Encounter (INDEPENDENT_AMBULATORY_CARE_PROVIDER_SITE_OTHER): Payer: Self-pay | Admitting: *Deleted

## 2013-11-05 ENCOUNTER — Other Ambulatory Visit (INDEPENDENT_AMBULATORY_CARE_PROVIDER_SITE_OTHER): Payer: Self-pay | Admitting: Internal Medicine

## 2013-11-08 ENCOUNTER — Other Ambulatory Visit (INDEPENDENT_AMBULATORY_CARE_PROVIDER_SITE_OTHER): Payer: Self-pay | Admitting: Internal Medicine

## 2013-11-08 DIAGNOSIS — K7030 Alcoholic cirrhosis of liver without ascites: Secondary | ICD-10-CM

## 2013-11-08 DIAGNOSIS — R188 Other ascites: Secondary | ICD-10-CM

## 2013-11-08 MED ORDER — SPIRONOLACTONE 25 MG PO TABS: 50.0000 mg | ORAL_TABLET | Freq: Every day | ORAL | Status: DC

## 2014-01-19 ENCOUNTER — Other Ambulatory Visit (INDEPENDENT_AMBULATORY_CARE_PROVIDER_SITE_OTHER): Payer: Self-pay | Admitting: Internal Medicine

## 2014-01-19 DIAGNOSIS — K7031 Alcoholic cirrhosis of liver with ascites: Secondary | ICD-10-CM

## 2014-01-24 ENCOUNTER — Ambulatory Visit (INDEPENDENT_AMBULATORY_CARE_PROVIDER_SITE_OTHER): Payer: Medicare Other | Admitting: Internal Medicine

## 2014-01-24 ENCOUNTER — Encounter (INDEPENDENT_AMBULATORY_CARE_PROVIDER_SITE_OTHER): Payer: Self-pay | Admitting: Internal Medicine

## 2014-01-24 ENCOUNTER — Encounter (INDEPENDENT_AMBULATORY_CARE_PROVIDER_SITE_OTHER): Payer: Self-pay | Admitting: *Deleted

## 2014-01-24 VITALS — BP 130/72 | HR 80 | Temp 97.6°F | Resp 18 | Ht 68.0 in | Wt 157.4 lb

## 2014-01-24 DIAGNOSIS — K7290 Hepatic failure, unspecified without coma: Secondary | ICD-10-CM

## 2014-01-24 DIAGNOSIS — K7682 Hepatic encephalopathy: Secondary | ICD-10-CM

## 2014-01-24 DIAGNOSIS — K7031 Alcoholic cirrhosis of liver with ascites: Secondary | ICD-10-CM

## 2014-01-24 LAB — COMPREHENSIVE METABOLIC PANEL
ALT: 18 U/L (ref 0–53)
AST: 41 U/L — ABNORMAL HIGH (ref 0–37)
Albumin: 3.3 g/dL — ABNORMAL LOW (ref 3.5–5.2)
Alkaline Phosphatase: 81 U/L (ref 39–117)
BUN: 7 mg/dL (ref 6–23)
CALCIUM: 8.4 mg/dL (ref 8.4–10.5)
CHLORIDE: 100 meq/L (ref 96–112)
CO2: 25 meq/L (ref 19–32)
CREATININE: 1.11 mg/dL (ref 0.50–1.35)
GLUCOSE: 106 mg/dL — AB (ref 70–99)
Potassium: 4.1 mEq/L (ref 3.5–5.3)
Sodium: 135 mEq/L (ref 135–145)
Total Bilirubin: 3.9 mg/dL — ABNORMAL HIGH (ref 0.2–1.2)
Total Protein: 6.7 g/dL (ref 6.0–8.3)

## 2014-01-24 LAB — CBC
HEMATOCRIT: 37.8 % — AB (ref 39.0–52.0)
Hemoglobin: 13.5 g/dL (ref 13.0–17.0)
MCH: 33.9 pg (ref 26.0–34.0)
MCHC: 35.7 g/dL (ref 30.0–36.0)
MCV: 95 fL (ref 78.0–100.0)
Platelets: 108 10*3/uL — ABNORMAL LOW (ref 150–400)
RBC: 3.98 MIL/uL — ABNORMAL LOW (ref 4.22–5.81)
RDW: 16.7 % — ABNORMAL HIGH (ref 11.5–15.5)
WBC: 7.2 10*3/uL (ref 4.0–10.5)

## 2014-01-24 LAB — PROTIME-INR
INR: 1.54 — ABNORMAL HIGH (ref <1.50)
PROTHROMBIN TIME: 18.5 s — AB (ref 11.6–15.2)

## 2014-01-24 NOTE — Patient Instructions (Signed)
Physician will call with results of blood work and ultrasound when completed. 

## 2014-01-24 NOTE — Progress Notes (Signed)
Presenting complaint;  Followup for decompensated alcoholic liver disease.  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 He was last seen 6 months ago. He has lost 7 pounds since his last visit. He states his abdomen is not distended any more. His wife states he has not had any confusion spell since his last visit. He has at least 2 bowel movements daily. He denies melena or rectal bleeding. Patient states he is in the goal. He does a lot of work around the house. He has not had any alcohol in 18 months. He states he sure this not having one on a hot day after yard work.   Current Medications: Outpatient Encounter Prescriptions as of 01/24/2014  Medication Sig  . furosemide (LASIX) 20 MG tablet TAKE ONE TABLET BY MOUTH EVERY MONDAY, WEDNESDAY AND FRIDAY.  Marland Kitchen. lactulose (CHRONULAC) 10 GM/15ML solution TAKE ONE TABLESPOONFUL (15 MLS) BY MOUTH THREE TIMES DAILY  . Multiple Vitamins-Minerals (MULTIVITAL) tablet Take 1 tablet by mouth daily.  Marland Kitchen. oxyCODONE (ROXICODONE) 5 MG immediate release tablet Take 0.5 tablets (2.5 mg total) by mouth every 4 (four) hours as needed for pain.  Marland Kitchen. spironolactone (ALDACTONE) 25 MG tablet Take 2 tablets (50 mg total) by mouth daily.  . [DISCONTINUED] meclizine (ANTIVERT) 25 MG tablet Take 25 mg by mouth daily as needed for dizziness.     Objective: Blood pressure 130/72, pulse 80, temperature 97.6 F (36.4 C), temperature source Oral, resp. rate 18, height 5\' 8"  (1.727 m), weight 157 lb 6.4 oz (71.396 kg). Patient is alert and in no acute distress. Asterixis absent. Conjunctiva is pink. Sclera is slightly icteric. Oropharyngeal mucosa is normal. No neck masses or thyromegaly noted. Cardiac exam with regular rhythm normal S1 and S2. No murmur or gallop noted. Lungs are clear to auscultation. Abdomen soft and  nontender without hepatosplenomegaly. Shifting dullness is absent. No LE edema or clubbing noted.  Labs/studies Results: Lab data from 07/25/2013 WBC 6.2, H&H 12.7 and 35.6 and platelet count 93K Bilirubin 3.8 indirect 2.5; BP 76, AST 46, ALT 22, total protein 6.5 albumin 3.3. Serum sodium 138 potassium 3.8, chloride 103, CO2 24, BUN 10, creatinine 1.13, glucose 155 and calcium 8.6.    Assessment:  #1. Alcoholic cirrhosis complicated by ascites and hepatic encephalopathy. Clinically he does not have encephalopathy and ascites is well controlled with therapy. #2. 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 #3. Anemia. He has mild anemia secondary to chronic disease. H&H 6 months ago was better than it has been for over a year.  #4. Thrombocytopenia secondary to cirrhosis.    Plan:  Patient will go to the lab for CBC, comprehensive chemistry panel, INR and AFP. Abdominal ultrasound primary for HCC screening. Office visit in 6 months.

## 2014-01-25 ENCOUNTER — Ambulatory Visit (HOSPITAL_COMMUNITY)
Admission: RE | Admit: 2014-01-25 | Discharge: 2014-01-25 | Disposition: A | Discharge status: Home / Self Care | Payer: Medicare Other | Source: Ambulatory Visit | Attending: Internal Medicine | Admitting: Internal Medicine

## 2014-01-25 DIAGNOSIS — K7031 Alcoholic cirrhosis of liver with ascites: Secondary | ICD-10-CM | POA: Diagnosis not present

## 2014-01-25 DIAGNOSIS — K7030 Alcoholic cirrhosis of liver without ascites: Secondary | ICD-10-CM | POA: Diagnosis present

## 2014-01-25 LAB — AFP TUMOR MARKER: AFP TUMOR MARKER: 7.9 ng/mL — AB (ref <6.1)

## 2014-04-21 ENCOUNTER — Other Ambulatory Visit (INDEPENDENT_AMBULATORY_CARE_PROVIDER_SITE_OTHER): Payer: Self-pay | Admitting: Internal Medicine

## 2014-05-22 ENCOUNTER — Other Ambulatory Visit (INDEPENDENT_AMBULATORY_CARE_PROVIDER_SITE_OTHER): Payer: Self-pay | Admitting: Internal Medicine

## 2014-06-23 ENCOUNTER — Other Ambulatory Visit (INDEPENDENT_AMBULATORY_CARE_PROVIDER_SITE_OTHER): Payer: Self-pay | Admitting: Internal Medicine

## 2014-07-17 ENCOUNTER — Other Ambulatory Visit (INDEPENDENT_AMBULATORY_CARE_PROVIDER_SITE_OTHER): Payer: Self-pay | Admitting: Internal Medicine

## 2014-07-20 ENCOUNTER — Other Ambulatory Visit (INDEPENDENT_AMBULATORY_CARE_PROVIDER_SITE_OTHER): Payer: Self-pay | Admitting: Internal Medicine

## 2014-07-31 ENCOUNTER — Ambulatory Visit (INDEPENDENT_AMBULATORY_CARE_PROVIDER_SITE_OTHER): Payer: Medicare Other | Admitting: Internal Medicine

## 2014-07-31 ENCOUNTER — Encounter (INDEPENDENT_AMBULATORY_CARE_PROVIDER_SITE_OTHER): Payer: Self-pay | Admitting: Internal Medicine

## 2014-07-31 VITALS — BP 130/76 | HR 80 | Temp 98.0°F | Resp 18 | Ht 68.0 in | Wt 158.2 lb

## 2014-07-31 DIAGNOSIS — K7290 Hepatic failure, unspecified without coma: Secondary | ICD-10-CM

## 2014-07-31 DIAGNOSIS — D696 Thrombocytopenia, unspecified: Secondary | ICD-10-CM

## 2014-07-31 DIAGNOSIS — K7682 Hepatic encephalopathy: Secondary | ICD-10-CM

## 2014-07-31 DIAGNOSIS — K7031 Alcoholic cirrhosis of liver with ascites: Secondary | ICD-10-CM

## 2014-07-31 LAB — CBC
HEMATOCRIT: 38.7 % — AB (ref 39.0–52.0)
Hemoglobin: 13.3 g/dL (ref 13.0–17.0)
MCH: 34.6 pg — AB (ref 26.0–34.0)
MCHC: 34.4 g/dL (ref 30.0–36.0)
MCV: 100.8 fL — ABNORMAL HIGH (ref 78.0–100.0)
MPV: 9.8 fL (ref 8.6–12.4)
Platelets: 120 10*3/uL — ABNORMAL LOW (ref 150–400)
RBC: 3.84 MIL/uL — AB (ref 4.22–5.81)
RDW: 16.4 % — AB (ref 11.5–15.5)
WBC: 7.5 10*3/uL (ref 4.0–10.5)

## 2014-07-31 MED ORDER — LACTULOSE 10 GM/15ML PO SOLN: 10.0000 g | Freq: Three times a day (TID) | ORAL | Status: DC

## 2014-07-31 NOTE — Patient Instructions (Signed)
Physician will call with results of blood work and ultrasound and completed. 

## 2014-07-31 NOTE — Progress Notes (Signed)
Presenting complaint;  Follow-up for decompensated alcoholic liver disease.  Subjective:  Patient is 74 year old Caucasian male who has alcoholic cirrhosis complicated by hepatic encephalopathy and ascites and is here for scheduled visit. He was last seen 6 months ago. At that time he had abdominal ultrasound it was negative for ascites and therefore furosemide was discontinued.  He is accompanied by his wife. He has not had any alcohol in over 20 months.  He has no complaints other than chronic leg pain for which she takes oxycodone 5 mg once or twice a day which is prescribed by Dr. Delbert Harnesson [facility].  He denies abdominal pain nausea vomiting heartburn dysphagia melena or rectal bleeding. He has at least 2-3 bowel movements per day.  He has not had any confusion recently.  He stays busy and does yard work.   Current Medications: Outpatient Encounter Prescriptions as of 07/31/2014  Medication Sig  . lactulose (CHRONULAC) 10 GM/15ML solution TAKE 15 MLS BY MOUTH  THREE TIMES DAILY  . Multiple Vitamins-Minerals (MULTIVITAL) tablet Take 1 tablet by mouth daily.  Marland Kitchen. oxyCODONE (ROXICODONE) 5 MG immediate release tablet Take 0.5 tablets (2.5 mg total) by mouth every 4 (four) hours as needed for pain.  Marland Kitchen. spironolactone (ALDACTONE) 25 MG tablet TAKE TWO TABLETS BY MOUTH ONCE DAILY  . furosemide (LASIX) 20 MG tablet TAKE ONE TABLET BY MOUTH EVERY MONDAY, WEDNESDAY AND FRIDAY. (Patient not taking: Reported on 07/31/2014)     Objective: Blood pressure 130/76, pulse 80, temperature 98 F (36.7 C), temperature source Oral, resp. rate 18, height 5\' 8"  (1.727 m), weight 158 lb 3.2 oz (71.759 kg). Patient is alert and does not have asterixis. Conjunctiva is pink. Sclera is nonicteric Oropharyngeal mucosa is normal. No neck masses or thyromegaly noted. Cardiac exam with regular rhythm normal S1 and S2. No murmur or gallop noted. Lungs are clear to auscultation. Abdomen symmetrical and soft. It is nontender.  Spleen is not palpable. Liver edge is firm below RCM. Shifting dullness is absent.  No LE edema or clubbing noted.  Labs/studies Results:  lab data from 01/24/2014  WBC 7.2 H&H 13.5 and 37.8. Platelet count 108K  BUN 7 creatinine 1.11  Bilirubin 3.9, AP 81, AST 41, ALT 18 and albumin 3.3.  AFP was 7.9  Assessment:  #1.  Alcoholic cirrhosis complicated by ascites and hepatic encephalopathy. Ascites has been well controlled with therapy and also due to improved hepatic function. Similarly HE has cleared with lactulose. He has declined to be screen for esophageal varices now as in the past. #2.  Thrombocytopenia secondary to chronic liver disease. #3.  Patient is average risk for CRC but has declined screening in the past and again today.   Plan:  Patient will go the lab for CBC, LFTs, metabolic 7, AFP and INR. Upper abdominal ultrasound for screening.  Office visit in 6 months.

## 2014-08-01 LAB — PROTIME-INR
INR: 1.6 — ABNORMAL HIGH (ref <1.50)
Prothrombin Time: 19.1 seconds — ABNORMAL HIGH (ref 11.6–15.2)

## 2014-08-01 LAB — HEPATIC FUNCTION PANEL
ALBUMIN: 3.4 g/dL — AB (ref 3.5–5.2)
ALK PHOS: 71 U/L (ref 39–117)
ALT: 18 U/L (ref 0–53)
AST: 44 U/L — AB (ref 0–37)
Bilirubin, Direct: 1 mg/dL — ABNORMAL HIGH (ref 0.0–0.3)
Indirect Bilirubin: 2.6 mg/dL — ABNORMAL HIGH (ref 0.2–1.2)
Total Bilirubin: 3.6 mg/dL — ABNORMAL HIGH (ref 0.2–1.2)
Total Protein: 6.6 g/dL (ref 6.0–8.3)

## 2014-08-01 LAB — BASIC METABOLIC PANEL
BUN: 11 mg/dL (ref 6–23)
CO2: 25 mEq/L (ref 19–32)
Calcium: 8.5 mg/dL (ref 8.4–10.5)
Chloride: 102 mEq/L (ref 96–112)
Creat: 1.06 mg/dL (ref 0.50–1.35)
Glucose, Bld: 145 mg/dL — ABNORMAL HIGH (ref 70–99)
POTASSIUM: 4.2 meq/L (ref 3.5–5.3)
Sodium: 138 mEq/L (ref 135–145)

## 2014-08-01 LAB — AFP TUMOR MARKER: AFP-Tumor Marker: 7 ng/mL — ABNORMAL HIGH (ref <6.1)

## 2014-08-03 ENCOUNTER — Ambulatory Visit (HOSPITAL_COMMUNITY)
Admission: RE | Admit: 2014-08-03 | Discharge: 2014-08-03 | Disposition: A | Discharge status: Home / Self Care | Payer: Medicare Other | Source: Ambulatory Visit | Attending: Internal Medicine | Admitting: Internal Medicine

## 2014-08-03 DIAGNOSIS — K7031 Alcoholic cirrhosis of liver with ascites: Secondary | ICD-10-CM | POA: Insufficient documentation

## 2014-08-06 ENCOUNTER — Other Ambulatory Visit (INDEPENDENT_AMBULATORY_CARE_PROVIDER_SITE_OTHER): Payer: Self-pay | Admitting: Internal Medicine

## 2015-01-30 ENCOUNTER — Encounter (INDEPENDENT_AMBULATORY_CARE_PROVIDER_SITE_OTHER): Payer: Self-pay | Admitting: *Deleted

## 2015-01-30 ENCOUNTER — Ambulatory Visit (INDEPENDENT_AMBULATORY_CARE_PROVIDER_SITE_OTHER): Payer: Medicare Other | Admitting: Internal Medicine

## 2015-01-30 ENCOUNTER — Encounter (INDEPENDENT_AMBULATORY_CARE_PROVIDER_SITE_OTHER): Payer: Self-pay | Admitting: Internal Medicine

## 2015-01-30 VITALS — BP 120/76 | HR 74 | Temp 97.7°F | Resp 18 | Ht 68.0 in | Wt 156.7 lb

## 2015-01-30 DIAGNOSIS — K7290 Hepatic failure, unspecified without coma: Secondary | ICD-10-CM

## 2015-01-30 DIAGNOSIS — D696 Thrombocytopenia, unspecified: Secondary | ICD-10-CM | POA: Diagnosis not present

## 2015-01-30 DIAGNOSIS — K7682 Hepatic encephalopathy: Secondary | ICD-10-CM

## 2015-01-30 DIAGNOSIS — K7030 Alcoholic cirrhosis of liver without ascites: Secondary | ICD-10-CM

## 2015-01-30 LAB — BASIC METABOLIC PANEL
BUN: 8 mg/dL (ref 7–25)
CHLORIDE: 103 mmol/L (ref 98–110)
CO2: 28 mmol/L (ref 20–31)
CREATININE: 0.9 mg/dL (ref 0.70–1.18)
Calcium: 8.3 mg/dL — ABNORMAL LOW (ref 8.6–10.3)
GLUCOSE: 140 mg/dL — AB (ref 65–99)
Potassium: 3.9 mmol/L (ref 3.5–5.3)
Sodium: 137 mmol/L (ref 135–146)

## 2015-01-30 LAB — CBC
HCT: 38 % — ABNORMAL LOW (ref 39.0–52.0)
HEMOGLOBIN: 13.3 g/dL (ref 13.0–17.0)
MCH: 34.5 pg — AB (ref 26.0–34.0)
MCHC: 35 g/dL (ref 30.0–36.0)
MCV: 98.4 fL (ref 78.0–100.0)
MPV: 9.6 fL (ref 8.6–12.4)
Platelets: 109 10*3/uL — ABNORMAL LOW (ref 150–400)
RBC: 3.86 MIL/uL — AB (ref 4.22–5.81)
RDW: 17.3 % — ABNORMAL HIGH (ref 11.5–15.5)
WBC: 6.6 10*3/uL (ref 4.0–10.5)

## 2015-01-30 LAB — HEPATIC FUNCTION PANEL
ALK PHOS: 83 U/L (ref 40–115)
ALT: 29 U/L (ref 9–46)
AST: 63 U/L — ABNORMAL HIGH (ref 10–35)
Albumin: 3.2 g/dL — ABNORMAL LOW (ref 3.6–5.1)
BILIRUBIN TOTAL: 3.9 mg/dL — AB (ref 0.2–1.2)
Bilirubin, Direct: 1.3 mg/dL — ABNORMAL HIGH (ref <=0.2)
Indirect Bilirubin: 2.6 mg/dL — ABNORMAL HIGH (ref 0.2–1.2)
TOTAL PROTEIN: 6.4 g/dL (ref 6.1–8.1)

## 2015-01-30 MED ORDER — LACTULOSE 10 GM/15ML PO SOLN: 10.0000 g | Freq: Three times a day (TID) | ORAL | Status: DC

## 2015-01-30 NOTE — Progress Notes (Signed)
Presenting complaint;  Follow-up for chronic liver disease.  Subjective:  Patient is 74 year old Caucasian male who is here for scheduled visit accompanied by his wife. He was last seen 6 months ago. He continues to abstain from drinking alcohol. He has good appetite. He denies heartburn dysphagia nausea vomiting abdominal pain melena or rectal bleeding. He has 3-4 bowel movements daily. According to his wife he has not been confused in a long time. Patient states he stays busy and does a lot of yard work. He requests refill on lactulose.  Current Medications: Outpatient Encounter Prescriptions as of 01/30/2015  Medication Sig  . lactulose (CHRONULAC) 10 GM/15ML solution Take 15 mLs (10 g total) by mouth 3 (three) times daily.  . Multiple Vitamins-Minerals (MULTIVITAL) tablet Take 1 tablet by mouth daily.  Marland Kitchen oxyCODONE (ROXICODONE) 5 MG immediate release tablet Take 0.5 tablets (2.5 mg total) by mouth every 4 (four) hours as needed for pain.   No facility-administered encounter medications on file as of 01/30/2015.     Objective: Blood pressure 120/76, pulse 74, temperature 97.7 F (36.5 C), temperature source Oral, resp. rate 18, height  (1.727 m), weight 156 lb 11.2 oz (71.079 kg). Patient is alert and in no acute distress. He does not have asterixis. Conjunctiva is pink. Sclera is nonicteric Oropharyngeal mucosa is normal. No neck masses or thyromegaly noted. Cardiac exam with regular rhythm normal S1 and S2. No murmur or gallop noted. Lungs are clear to auscultation. Abdomen abdomen is full but soft and nontender. Spleen tip is palpable. Liver edge is indistinct. Flexor not.. No LE edema or clubbing noted.  Labs/studies Results: Ultrasound on 08/03/2014 revealed cirrhotic liver without focal abnormalities prominent gallbladder wall but without gallstones normal-sized spleen and no evidence of ascites.    Assessment:  #1. Alcoholic cirrhosis complicated by ascites which  has resolved with improvement in hepatic function also confirmed on ultrasound of 08/03/2014 at which time diarrhetic therapy was discontinued. It is reassuring to know that he does not have ascites on exam. He is due for [facility] screening. He has never been screened for esophageal varices since he has declined EGD. This is the first time that he does not appear jaundiced on exam. #2. Hepatic encephalopathy. Clinically he does not appear to be encephalopathic. #3. He is average risk for CRC. He has declined screening colonoscopy in the past.   Plan:  Upper abdominal ultrasound for HCC screening. Patient will go to the lab for CBC LFTs metabolic 7 INR AFP and serum ammonia. New prescription issued for lactulose 1350 mL with 5 refills. Office visit in 6 months.

## 2015-01-30 NOTE — Patient Instructions (Signed)
Physician will call with results of blood tests and ultrasound when completed 

## 2015-01-31 ENCOUNTER — Ambulatory Visit (HOSPITAL_COMMUNITY)
Admission: RE | Admit: 2015-01-31 | Discharge: 2015-01-31 | Disposition: A | Discharge status: Home / Self Care | Payer: Medicare Other | Source: Ambulatory Visit | Attending: Internal Medicine | Admitting: Internal Medicine

## 2015-01-31 DIAGNOSIS — R188 Other ascites: Secondary | ICD-10-CM | POA: Insufficient documentation

## 2015-01-31 DIAGNOSIS — K7030 Alcoholic cirrhosis of liver without ascites: Secondary | ICD-10-CM | POA: Insufficient documentation

## 2015-01-31 DIAGNOSIS — R938 Abnormal findings on diagnostic imaging of other specified body structures: Secondary | ICD-10-CM | POA: Diagnosis not present

## 2015-01-31 LAB — AFP TUMOR MARKER: AFP-Tumor Marker: 5.8 ng/mL (ref <6.1)

## 2015-01-31 LAB — PROTIME-INR
INR: 1.83 — AB (ref <1.50)
Prothrombin Time: 21.4 seconds — ABNORMAL HIGH (ref 11.6–15.2)

## 2015-01-31 LAB — AMMONIA: AMMONIA: 63 umol/L — AB (ref 16–53)

## 2015-05-15 IMAGING — CT CT ABD-PEL WO/W CM
2 of 9 series · 11 of 46 positions shown, 17 images · IV contrast (Omnipaque 300)
Comparison: None

CLINICAL DATA: Alcoholic cirrhosis.  Jaundice.  Elevated liver
enzymes.

CT ABDOMEN WITHOUT AND WITH CONTRAST
CT PELVIS WITH CONTRAST
TECHNIQUE: Multidetector CT imaging of the abdomen was performed
initially following the standard protocol before administration of
intravenous contrast.  Multidetector CT imaging of the abdomen and
pelvis was then performed following the standard protocol during
the bolus injection of intravenous contrast.
Contrast: 100mL OMNIPAQUE IOHEXOL 300 MG/ML  SOLN

[Series 4: mpr arterial cor (id) · coronal · arterial · 0.52mm/px · 3 of 96 slices shown, 4 images]
[im 24/96  soft-tissue]
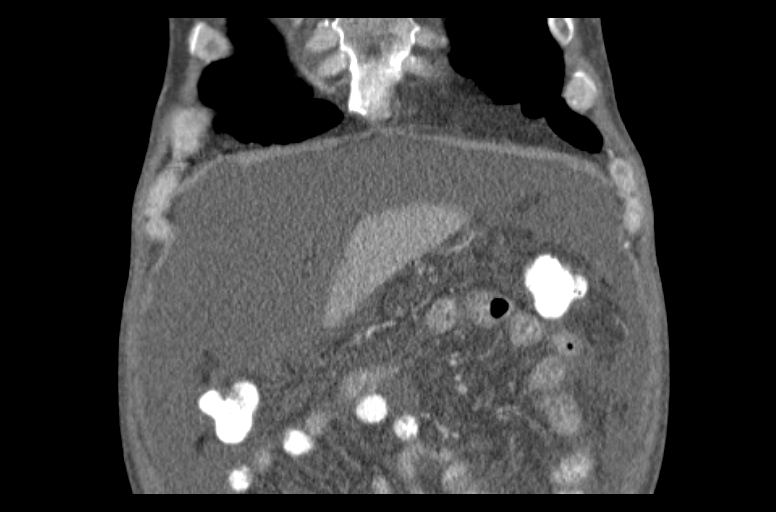
[im 48/96  soft-tissue]
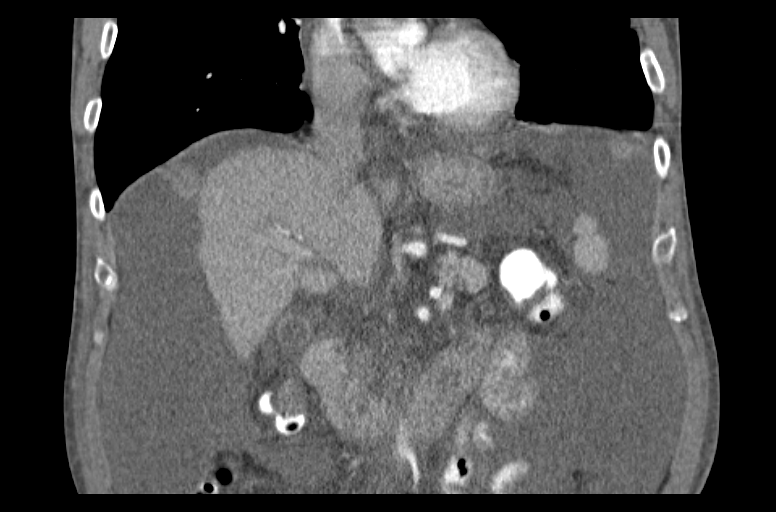
[im 48/96  bone]
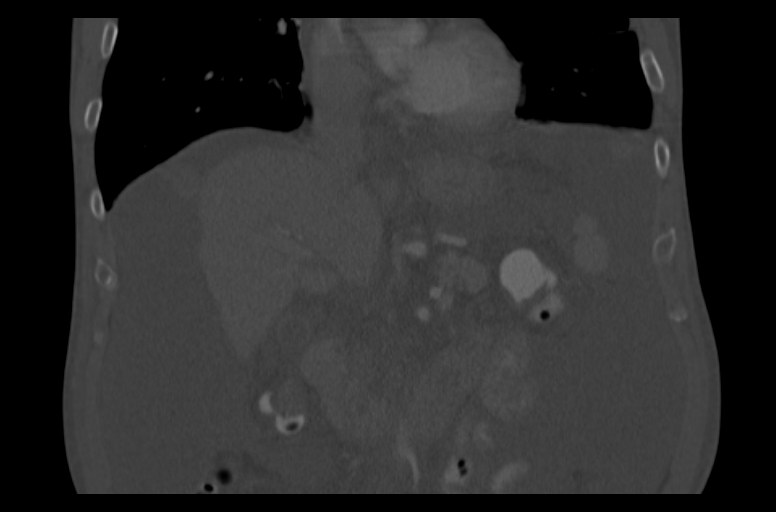
[im 72/96  soft-tissue]
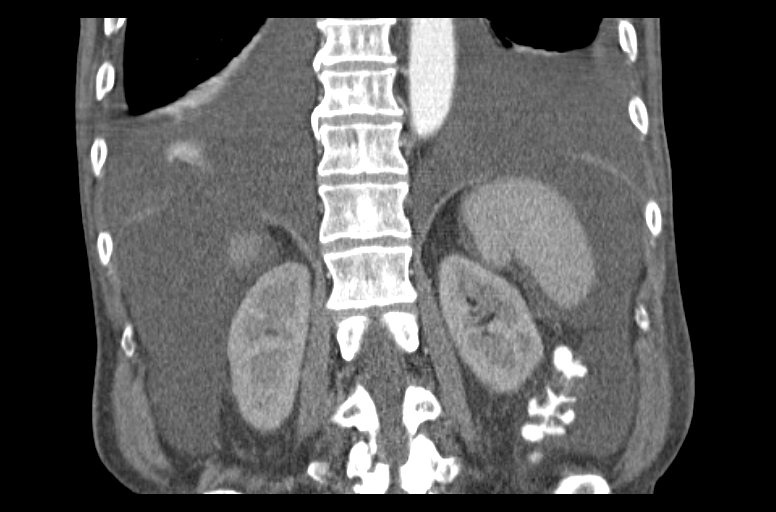

[Series 7: venous 60 sec 3.0 b40f · axial · portal-venous · 0.71mm/px · z∈[-430,-55]mm · 8 of 161 slices shown, 13 images]
[im 18/161  soft-tissue]
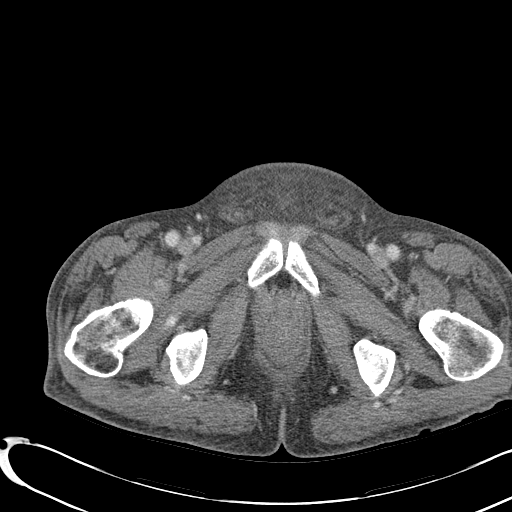
[im 18/161  bone]
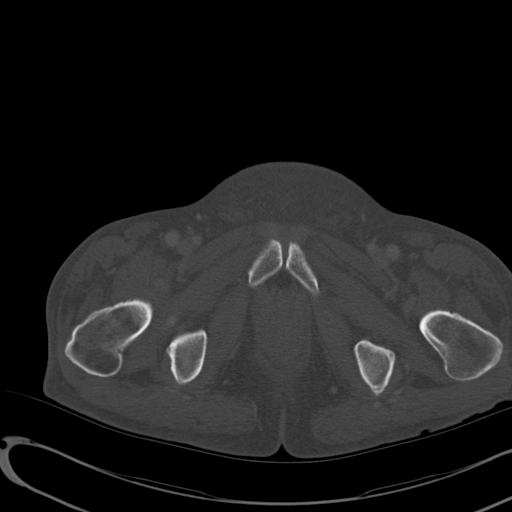
[im 36/161  soft-tissue]
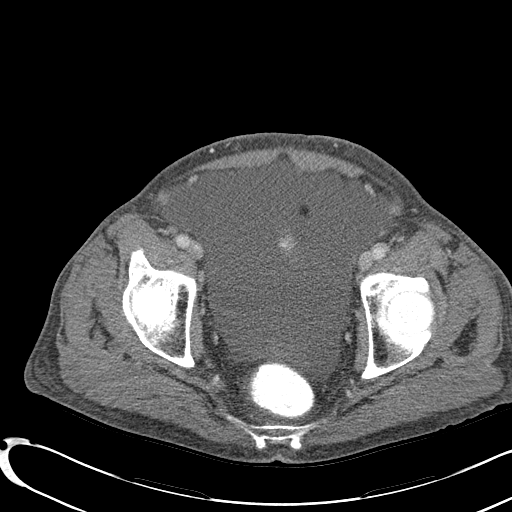
[im 54/161  soft-tissue]
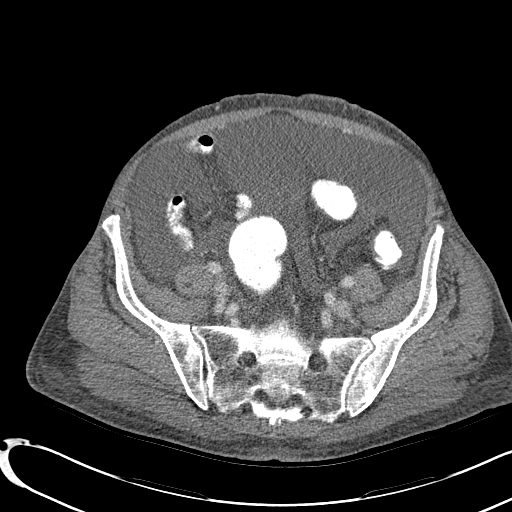
[im 72/161  soft-tissue]
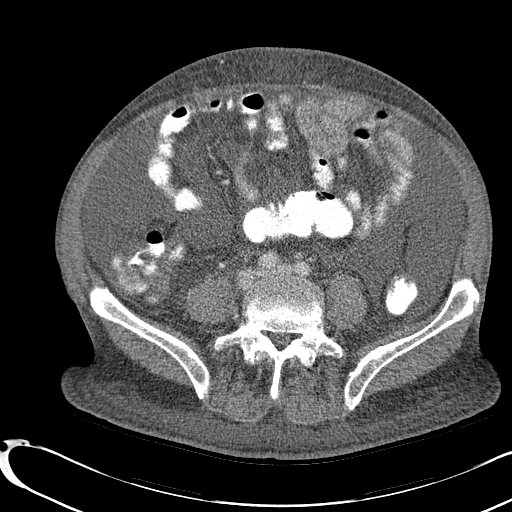
[im 89/161  soft-tissue]
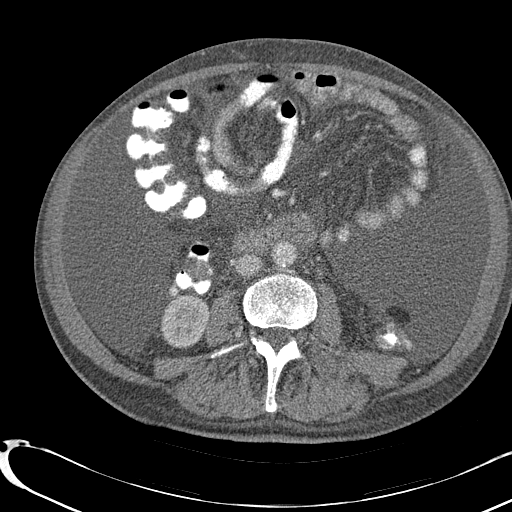
[im 89/161  lung]
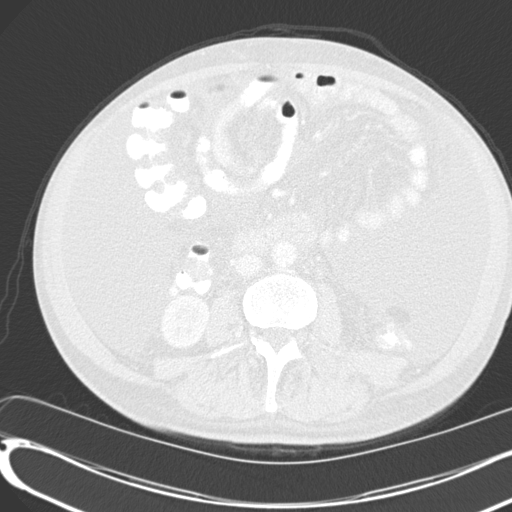
[im 107/161  soft-tissue]
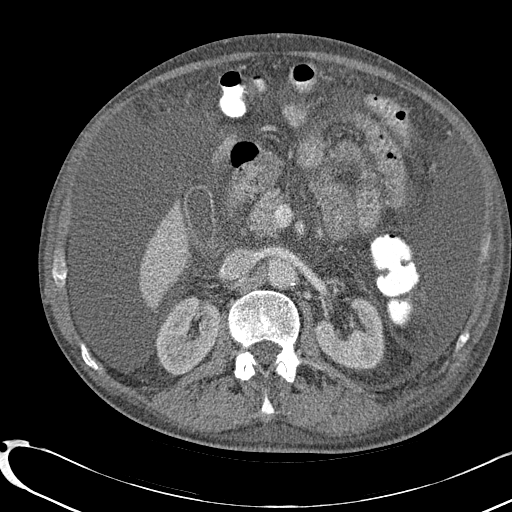
[im 107/161  lung]
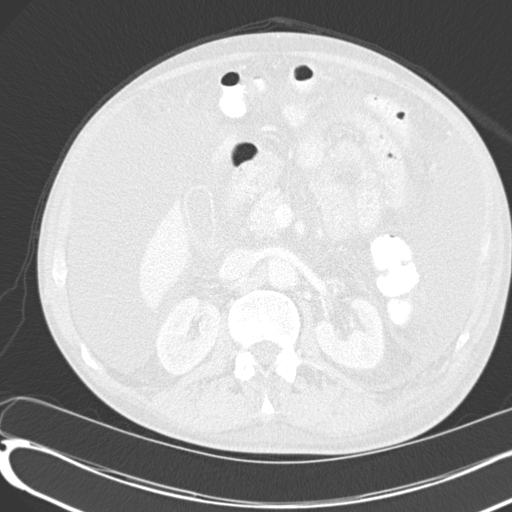
[im 125/161  soft-tissue]
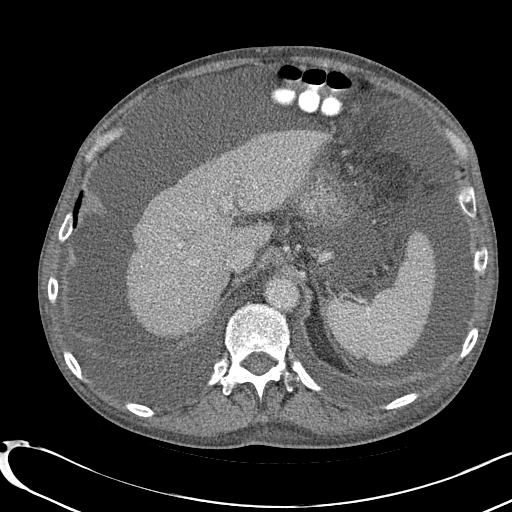
[im 125/161  lung]
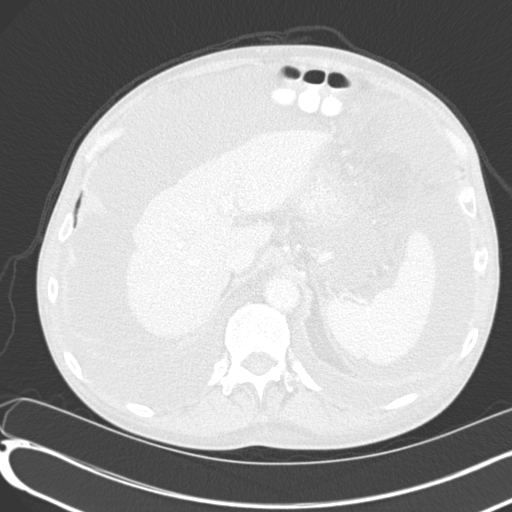
[im 143/161  soft-tissue]
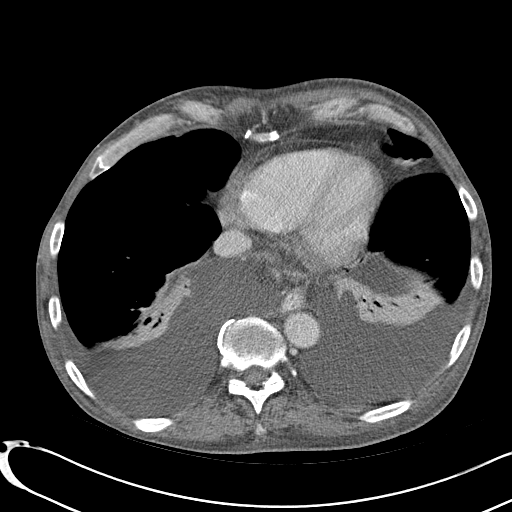
[im 143/161  lung]
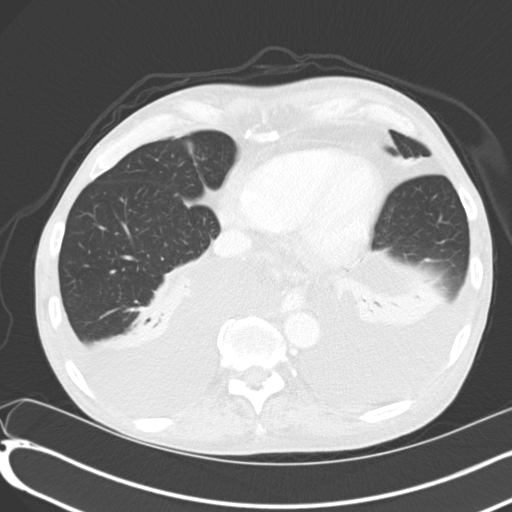

[11 of 46 positions shown; findings below may reference images not displayed]

FINDINGS: Cirrhotic shrunken liver without focal mass.  Portal
vein and splenic vein are patent. Spleen does not appear enlarged.
Upper abdominal varices.  Prominent ascites.

Bilateral pleural effusions with basilar atelectasis.

Evaluation of bowel limited by the ascites.  No free
intraperitoneal air.  Portions of colon and stomach are not
distended and evaluation limited.

No calcified gallstones.

No worrisome hepatic, splenic, pancreatic, renal or adrenal lesion.

No bony destructive lesion.
IMPRESSION: Cirrhotic shrunken liver without focal mass.

Portal vein and splenic vein are patent.

Spleen does not appear enlarged.

Upper abdominal varices.

Prominent ascites.

Bilateral pleural effusions with basilar atelectasis.

## 2015-08-01 ENCOUNTER — Encounter (INDEPENDENT_AMBULATORY_CARE_PROVIDER_SITE_OTHER): Payer: Self-pay | Admitting: *Deleted

## 2015-08-07 ENCOUNTER — Encounter (INDEPENDENT_AMBULATORY_CARE_PROVIDER_SITE_OTHER): Payer: Self-pay | Admitting: Internal Medicine

## 2015-08-07 ENCOUNTER — Ambulatory Visit (INDEPENDENT_AMBULATORY_CARE_PROVIDER_SITE_OTHER): Payer: Medicare Other | Admitting: Internal Medicine

## 2015-08-07 VITALS — BP 140/80 | HR 72 | Temp 97.3°F | Resp 18 | Ht 68.0 in | Wt 161.2 lb

## 2015-08-07 DIAGNOSIS — K7682 Hepatic encephalopathy: Secondary | ICD-10-CM

## 2015-08-07 DIAGNOSIS — K7290 Hepatic failure, unspecified without coma: Secondary | ICD-10-CM

## 2015-08-07 DIAGNOSIS — K7030 Alcoholic cirrhosis of liver without ascites: Secondary | ICD-10-CM | POA: Diagnosis not present

## 2015-08-07 LAB — PROTIME-INR
INR: 1.67 — ABNORMAL HIGH (ref <1.50)
PROTHROMBIN TIME: 19.9 s — AB (ref 11.6–15.2)

## 2015-08-07 LAB — COMPREHENSIVE METABOLIC PANEL
ALT: 23 U/L (ref 9–46)
AST: 55 U/L — ABNORMAL HIGH (ref 10–35)
Albumin: 3.2 g/dL — ABNORMAL LOW (ref 3.6–5.1)
Alkaline Phosphatase: 81 U/L (ref 40–115)
BUN: 8 mg/dL (ref 7–25)
CALCIUM: 8.3 mg/dL — AB (ref 8.6–10.3)
CHLORIDE: 105 mmol/L (ref 98–110)
CO2: 26 mmol/L (ref 20–31)
Creat: 1.04 mg/dL (ref 0.70–1.18)
Glucose, Bld: 92 mg/dL (ref 65–99)
POTASSIUM: 4.2 mmol/L (ref 3.5–5.3)
Sodium: 141 mmol/L (ref 135–146)
TOTAL PROTEIN: 6.4 g/dL (ref 6.1–8.1)
Total Bilirubin: 3.4 mg/dL — ABNORMAL HIGH (ref 0.2–1.2)

## 2015-08-07 LAB — CBC
HCT: 38.7 % (ref 38.5–50.0)
HEMOGLOBIN: 13.6 g/dL (ref 13.2–17.1)
MCH: 35.4 pg — AB (ref 27.0–33.0)
MCHC: 35.1 g/dL (ref 32.0–36.0)
MCV: 100.8 fL — AB (ref 80.0–100.0)
MPV: 9.6 fL (ref 7.5–12.5)
Platelets: 95 10*3/uL — ABNORMAL LOW (ref 140–400)
RBC: 3.84 MIL/uL — ABNORMAL LOW (ref 4.20–5.80)
RDW: 17.8 % — ABNORMAL HIGH (ref 11.0–15.0)
WBC: 6.6 10*3/uL (ref 3.8–10.8)

## 2015-08-07 MED ORDER — LACTULOSE 10 GM/15ML PO SOLN: 10.0000 g | Freq: Three times a day (TID) | ORAL | Status: DC

## 2015-08-07 NOTE — Patient Instructions (Signed)
Physician will call with results of blood tests when completed. 

## 2015-08-07 NOTE — Progress Notes (Signed)
Presenting complaint;  Follow-up for alcoholic cirrhosis and hepatic encephalopathy.  Subjective:  Patient is 75 year old Caucasian male was alcoholic cirrhosis and is here for scheduled visit. He was last seen on 01/30/2015. He has gained 5 pounds since last visit. He feels well. He has good appetite. He has at least 2 bowel movements daily. Most of his stools are soft. He denies abdominal pain distention melena or rectal bleeding. He is also not having any problem with lower extremity edema. He takes 1-2 pain pills per day and no more. He takes it for a lower extremity pain as well as joint pains. His wife states he has not had confusion in a long time. He continues to abstain from drinking alcohol. Patient is agreeable to having blood work but does not want an ultrasound. He states he needs new prescription for lactulose.   Current Medications: Outpatient Encounter Prescriptions as of 08/07/2015  Medication Sig  . lactulose (CHRONULAC) 10 GM/15ML solution Take 15 mLs (10 g total) by mouth 3 (three) times daily.  . Multiple Vitamins-Minerals (MULTIVITAL) tablet Take 1 tablet by mouth daily.  Marland Kitchen. oxyCODONE (ROXICODONE) 5 MG immediate release tablet Take 0.5 tablets (2.5 mg total) by mouth every 4 (four) hours as needed for pain.   No facility-administered encounter medications on file as of 08/07/2015.     Objective: Blood pressure 140/80, pulse 72, temperature 97.3 F (36.3 C), temperature source Oral, resp. rate 18, height 5\' 8"  (1.727 m), weight 161 lb 3.2 oz (73.12 kg). Patient is alert and in no acute distress. He does not have asterixis or tremors. Conjunctiva is pink. Sclera does not appear to be icteric Oropharyngeal mucosa is normal. No neck masses or thyromegaly noted. Cardiac exam with regular rhythm normal S1 and S2. No murmur or gallop noted. Lungs are clear to auscultation. Abdomen is symmetrical soft and nontender. Spleen is not palpable. Liver edge is indistinct below  RCM. Flanks are not bulging. No LE edema or clubbing noted.  Labs/studies Results: Data from 01/30/2015  WBC 6.6, H&H 13.3 and 38.0 and platelet count 109K.  Bilirubin 3.9, AP 83, AST 63, ALT 29 and albumin 3.2. BUN 8 and creatinine 0.90 INR 1.13 and AP 5.8.  Ultrasound on 01/31/2015 revealed cirrhotic appearing liver thickened gallbladder wall and ascites.  Assessment:  #1. Alcoholic cirrhosis complicated by ascites and hepatic encephalopathy. Ascites has resolved with improvement in hepatic function and he has been able to come off diuretic therapy. [facility] but does not want to have ultrasound until after next visit. #2. Hepatic encephalopathy. He appears to be doing well with lactulose. #3. Thrombocytopenia secondary to cirrhosis.    Plan:  Patient will go to the lab for CBC, comprehensive chemistry panel INR and AFP. Patient will continue low-salt diet. Office visit in 6 months.

## 2015-08-08 LAB — AFP TUMOR MARKER: AFP TUMOR MARKER: 5.8 ng/mL (ref <6.1)

## 2016-02-05 ENCOUNTER — Encounter (INDEPENDENT_AMBULATORY_CARE_PROVIDER_SITE_OTHER): Payer: Self-pay | Admitting: Internal Medicine

## 2016-02-05 ENCOUNTER — Ambulatory Visit (INDEPENDENT_AMBULATORY_CARE_PROVIDER_SITE_OTHER): Payer: Medicare Other | Admitting: Internal Medicine

## 2016-02-05 VITALS — BP 118/78 | HR 72 | Temp 98.1°F | Resp 18 | Ht 68.0 in | Wt 154.3 lb

## 2016-02-05 DIAGNOSIS — K7290 Hepatic failure, unspecified without coma: Secondary | ICD-10-CM | POA: Diagnosis not present

## 2016-02-05 DIAGNOSIS — K7030 Alcoholic cirrhosis of liver without ascites: Secondary | ICD-10-CM | POA: Diagnosis not present

## 2016-02-05 DIAGNOSIS — K7682 Hepatic encephalopathy: Secondary | ICD-10-CM

## 2016-02-05 LAB — PROTIME-INR
INR: 1.7 — ABNORMAL HIGH
Prothrombin Time: 17.4 s — ABNORMAL HIGH (ref 9.0–11.5)

## 2016-02-05 LAB — BILIRUBIN, TOTAL: BILIRUBIN TOTAL: 2.7 mg/dL — AB (ref 0.2–1.2)

## 2016-02-05 LAB — AMMONIA: Ammonia: 44 umol/L (ref <=47)

## 2016-02-05 NOTE — Patient Instructions (Signed)
Physician will call with results of blood tests when completed. 

## 2016-02-05 NOTE — Progress Notes (Signed)
Presenting complaint;  Follow-up for chronic liver disease.  Subjective:  Patient is 75 year old Caucasian male who has advanced alcoholic cirrhosis who presents for scheduled visit accompanied by his wife. He was last seen on 08/07/2015. He has no complaints. His wife states that he has a good breakfast and thereafter would not eat regular meals but eats snacks and drinks an sure. He has at least 2 bowel movements per day. He is on low-salt diet. His wife states he has never been confused. He denies heartburn nausea vomiting abdominal pain melena or rectal bleeding. Patient stays busy. He does yardwork cuts wood and works on his car when he can. His wife states he takes pain medication no more than twice daily for ankle pain. He has not had any alcohol in over 3 years.   Current Medications: Outpatient Encounter Prescriptions as of 02/05/2016  Medication Sig  . lactulose (CHRONULAC) 10 GM/15ML solution Take 15 mLs (10 g total) by mouth 3 (three) times daily.  . Multiple Vitamins-Minerals (MULTIVITAL) tablet Take 1 tablet by mouth daily.  Marland Kitchen. oxyCODONE (ROXICODONE) 5 MG immediate release tablet Take 0.5 tablets (2.5 mg total) by mouth every 4 (four) hours as needed for pain.   No facility-administered encounter medications on file as of 02/05/2016.      Objective: Blood pressure 118/78, pulse 72, temperature 98.1 F (36.7 C), temperature source Oral, resp. rate 18, height 5\' 8"  (1.727 m), weight 154 lb 4.8 oz (70 kg). Patient is alert and in no acute distress. He does not have asterixis. Conjunctiva is pink. Sclera is nonicteric Oropharyngeal mucosa is normal. He has multiple dental fillings. No neck masses or thyromegaly noted. Mild bilateral gynecomastia but no spider angiomata noted. Cardiac exam with regular rhythm normal S1 and S2. No murmur or gallop noted. Lungs are clear to auscultation. Abdomen is symmetrical and soft without tenderness organomegaly or masses. Shifting  dullness is present. No LE edema or clubbing noted.  Labs/studies Results: Lab data from 10/19/2015(Dr. Janeece Fittingon Diego's office). WBC 6.5, H&H 12.3 and 35.5 and platelet count 137K.  Serum sodium 139, potassium 4.2, chloride 106, CO2 25, glucose 92, BUN 10, creatinine 0.97  Bilirubin 2.7, AP 82, AST 51, ALT 24, total protein 6.5 and albumin 3.1.  Serum calcium 8.4.  TSH 3.03  25-hydroxy vitamin D 34.   Last ultrasound was on 01/31/2015 revealing echogenic liver with nodular contour consistent with cirrhosis mild gallbladder wall thickening and ascites.   AFP 5.8 on 08/07/2015 INR 1.67 on 08/07/2015   Assessment:  #1. Alcoholic cirrhosis with ascites. On exam he has mild ascites. He is presently on low-salt diet and not needing diuretic therapy as before. Bilirubin is coming down but not normal. Similarly INR has not corrected. If her hepatic function remains compromised. Last ultrasound was 1 year ago. He declined to have ultrasound 6 months ago and once again today. Similarly he has declined screening for varices and CRC in the past. #2. Hepatic encephalopathy. Clinically he does not appear to be encephalopathic. Will obtain serum ammonia for the record. #3. Mild anemia secondary to chronic disease. #4.  Thrombocytopenia secondary to cirrhosis.  Plan:  Once again patient reminded that he cannot go back to drinking alcohol ever again. Doing so will quickly lead to hepatic failure. Patient will go to the lab for the following: Bilirubin. INR. Serum ammonia. Serum AFP. Office visit in 6 months.

## 2016-02-06 LAB — AFP TUMOR MARKER: AFP-Tumor Marker: 3.1 ng/mL (ref <6.1)

## 2016-03-07 ENCOUNTER — Encounter (INDEPENDENT_AMBULATORY_CARE_PROVIDER_SITE_OTHER): Payer: Self-pay

## 2016-05-06 ENCOUNTER — Other Ambulatory Visit (INDEPENDENT_AMBULATORY_CARE_PROVIDER_SITE_OTHER): Payer: Self-pay | Admitting: Internal Medicine

## 2016-05-06 DIAGNOSIS — K7682 Hepatic encephalopathy: Secondary | ICD-10-CM

## 2016-05-06 DIAGNOSIS — K7290 Hepatic failure, unspecified without coma: Secondary | ICD-10-CM

## 2016-08-05 ENCOUNTER — Encounter (INDEPENDENT_AMBULATORY_CARE_PROVIDER_SITE_OTHER): Payer: Self-pay

## 2016-08-05 ENCOUNTER — Encounter (INDEPENDENT_AMBULATORY_CARE_PROVIDER_SITE_OTHER): Payer: Self-pay | Admitting: Internal Medicine

## 2016-08-05 ENCOUNTER — Ambulatory Visit (INDEPENDENT_AMBULATORY_CARE_PROVIDER_SITE_OTHER): Payer: Medicare Other | Admitting: Internal Medicine

## 2016-08-05 VITALS — BP 120/72 | HR 70 | Temp 97.6°F | Resp 18 | Ht 68.0 in | Wt 158.9 lb

## 2016-08-05 DIAGNOSIS — K7030 Alcoholic cirrhosis of liver without ascites: Secondary | ICD-10-CM

## 2016-08-05 DIAGNOSIS — K7290 Hepatic failure, unspecified without coma: Secondary | ICD-10-CM | POA: Diagnosis not present

## 2016-08-05 DIAGNOSIS — K7682 Hepatic encephalopathy: Secondary | ICD-10-CM

## 2016-08-05 NOTE — Patient Instructions (Addendum)
Will request copy of recent blood work from Dr. Roe Coombs [facility]'s before any tests ordered. Check weight once a week and call if you gain more than 5 pounds.

## 2016-08-05 NOTE — Progress Notes (Signed)
Presenting complaint;  Follow-up for chronic liver disease.  Database and Subjective:  Patient is 77 year old Caucasian male who was diagnosed with decompensated alcoholic liver disease about 4 years ago when he presented with jaundice and some kind ascites. He was also diagnosed with hepatic encephalopathy. Patient was able to abstain from drinking alcohol and is gradually improved. Ultrasound 6 months ago was negative for ascites. Diuretic therapy was discontinued. He has been reluctant to undergo EGD for variceal screening or screening colonoscopy.  He was last seen 6 months ago and is here for scheduled visit accompanied by his wife. He has no complaints. He denies nausea vomiting heartburn dysphagia or abdominal pain. He has at least 2 bowel movements per day. He also denies melena or rectal bleeding. According to his wife he has not been confused in a long time. He takes pain medication usually at night because of foot and leg pain due to neuropathy. Patient states he had blood work by Dr. Delbert Harness weeks ago. He does not do regular walking or exercise. His wife states he watches too much TV.    Current Medications: Outpatient Encounter Prescriptions as of 08/05/2016  Medication Sig  . Cholecalciferol (VITAMIN D3 PO) Take 1,000 Units by mouth daily.  Marland Kitchen lactulose (CHRONULAC) 10 GM/15ML solution TAKE  15 ML BY MOUTH THREE TIMES DAILY  . Multiple Vitamins-Minerals (MULTIVITAL) tablet Take 1 tablet by mouth daily.  Marland Kitchen oxyCODONE (ROXICODONE) 5 MG immediate release tablet Take 0.5 tablets (2.5 mg total) by mouth every 4 (four) hours as needed for pain.   No facility-administered encounter medications on file as of 08/05/2016.      Objective: Blood pressure 120/72, pulse 70, temperature 97.6 F (36.4 C), temperature source Oral, resp. rate 18, height  (1.727 m), weight 158 lb 14.4 oz (72.1 kg). Patient is alert and does not have asterixis. Conjunctiva is pink. Sclera is  nonicteric Oropharyngeal mucosa is normal. No neck masses or thyromegaly noted. Cardiac exam with regular rhythm normal S1 and S2. No murmur or gallop noted. Lungs are clear to auscultation. Abdomen is full but soft and nontender without organomegaly or masses. Small umbilicus hernia noted. It is completely reducible. No LE edema or clubbing noted.  Labs/studies Results: Lab data from 06/17/2016  Bilirubin 2.4, AP 69, AST 32 and ALT 14.  Serum albumin 2.8.  Serum ammonia 49 (upper limit of normal is 47).   Assessment:  #1. Alcoholic cirrhosis. Hepatic function has improved since his condition was diagnosed 4 years ago. Ultrasound 6 months ago was negative for ascites. He is due for screening ultrasound but would like to wait another 6 months. #2. Hepatic encephalopathy. He appears to be doing well with lactulose.  Plan:  Will check CBC and AFP. He would undergo ultrasound in 6 months. Office visit in 6 months.

## 2016-08-07 ENCOUNTER — Other Ambulatory Visit (INDEPENDENT_AMBULATORY_CARE_PROVIDER_SITE_OTHER): Payer: Self-pay | Admitting: *Deleted

## 2016-08-07 DIAGNOSIS — K7030 Alcoholic cirrhosis of liver without ascites: Secondary | ICD-10-CM

## 2016-08-11 ENCOUNTER — Encounter (INDEPENDENT_AMBULATORY_CARE_PROVIDER_SITE_OTHER): Payer: Self-pay

## 2016-11-05 ENCOUNTER — Other Ambulatory Visit (INDEPENDENT_AMBULATORY_CARE_PROVIDER_SITE_OTHER): Payer: Self-pay | Admitting: Internal Medicine

## 2016-11-05 DIAGNOSIS — K7682 Hepatic encephalopathy: Secondary | ICD-10-CM

## 2016-11-05 DIAGNOSIS — K7290 Hepatic failure, unspecified without coma: Secondary | ICD-10-CM

## 2016-12-03 ENCOUNTER — Other Ambulatory Visit (INDEPENDENT_AMBULATORY_CARE_PROVIDER_SITE_OTHER): Payer: Self-pay | Admitting: Internal Medicine

## 2016-12-03 DIAGNOSIS — K7290 Hepatic failure, unspecified without coma: Secondary | ICD-10-CM

## 2016-12-03 DIAGNOSIS — K7682 Hepatic encephalopathy: Secondary | ICD-10-CM

## 2017-02-03 ENCOUNTER — Encounter (INDEPENDENT_AMBULATORY_CARE_PROVIDER_SITE_OTHER): Payer: Self-pay | Admitting: Internal Medicine

## 2017-02-03 ENCOUNTER — Ambulatory Visit (INDEPENDENT_AMBULATORY_CARE_PROVIDER_SITE_OTHER): Payer: Medicare Other | Admitting: Internal Medicine

## 2017-02-03 ENCOUNTER — Encounter (INDEPENDENT_AMBULATORY_CARE_PROVIDER_SITE_OTHER): Payer: Self-pay | Admitting: *Deleted

## 2017-02-03 ENCOUNTER — Encounter (INDEPENDENT_AMBULATORY_CARE_PROVIDER_SITE_OTHER): Payer: Self-pay

## 2017-02-03 VITALS — BP 110/72 | HR 68 | Temp 97.5°F | Resp 18 | Ht 68.0 in | Wt 145.7 lb

## 2017-02-03 DIAGNOSIS — R634 Abnormal weight loss: Secondary | ICD-10-CM | POA: Diagnosis not present

## 2017-02-03 DIAGNOSIS — K7682 Hepatic encephalopathy: Secondary | ICD-10-CM

## 2017-02-03 DIAGNOSIS — K7290 Hepatic failure, unspecified without coma: Secondary | ICD-10-CM

## 2017-02-03 DIAGNOSIS — K7030 Alcoholic cirrhosis of liver without ascites: Secondary | ICD-10-CM

## 2017-02-03 NOTE — Progress Notes (Signed)
Presenting complaint;  Follow-up for chronic liver disease and hepatic encephalopathy.  Subjective:  Patient is 76 year old Caucasian male who is here for scheduled visit accompanied by his wife Eunice Blase and their granddaughter. He was last seen 6 months ago. He has no complaints. He denies nausea vomiting heartburn or dysphagia. He also denies abdominal pain or distention. He has at least 2 bowel movements per day. He denies melena or rectal bleeding.  He has not had confusion spells. He takes pain medication once or twice daily. He is getting pain prescription from Dr. Delbert Harness. He has lost 13 pounds since his last visit. His wife states he does not eat regular meals. He eats snacks and ice cream. He stays busy. He has 2 outdoor dogs and his wife has two indoor dogs.Marland Kitchen He walks quite a bit. He states he had blood work by Dr. Delbert Harness recently. He is agreeable to have ultrasounds now.  Current Medications: Outpatient Encounter Prescriptions as of 02/03/2017  Medication Sig  . Cholecalciferol (VITAMIN D3 PO) Take 1,000 Units by mouth daily.  . CONSTULOSE 10 GM/15ML solution TAKE 15 ML BY MOUTH THREE TIMES DAILY  . Multiple Vitamins-Minerals (MULTIVITAL) tablet Take 1 tablet by mouth daily.  Marland Kitchen oxyCODONE (ROXICODONE) 5 MG immediate release tablet Take 0.5 tablets (2.5 mg total) by mouth every 4 (four) hours as needed for pain.   No facility-administered encounter medications on file as of 02/03/2017.      Objective: Blood pressure 110/72, pulse 68, temperature (!) 97.5 F (36.4 C), temperature source Oral, resp. rate 18, height  (1.727 m), weight 145 lb 11.2 oz (66.1 kg). Patient is alert and in no acute distress. Conjunctiva is pink. Sclera is nonicteric Oropharyngeal mucosa is normal. No neck masses or thyromegaly noted. Cardiac exam with regular rhythm normal S1 and S2. No murmur or gallop noted. Lungs are clear to auscultation. Abdomen is symmetrical with small umbilicus hernia.  Abdomen is soft and nontender without organomegaly or masses. Shifting dullness is absent.  No LE edema or clubbing noted.  Labs/studies Results: Last ultrasound was in [place] 2016.  It revealed changes of cirrhosis gallbladder wall thickening and ascites.   AFP 3.1 on 02/05/2016.  Assessment:  #1. Alcoholic cirrhosis. His hepatic function has gradually improved since he was diagnosed a few years ago. He had ascites to begin with but not anymore clinically. He's been off diuretic therapy. He has declined screening for varices in the past. He also has declined schedule HCC screening. He is agreeable to have ultrasounds now.  #2. Hepatic encephalopathy. He remained stable on lactulose and is tolerating when necessary pain medication that he takes for joint pains when necessary.  #3. Weight loss. He has lost 13 pounds since his last visit 6 months ago. I doubt weight loss is due to resolution of ascites since he had none on exam 6 months ago. Weight loss may be due to poor oral intake as she does not eat regular meals.   Plan:  Patient encouraged to eat 3 meals a day rather than snacking. Upper abdominal ultrasound. Request copy of recent blood work from Dr. Roe Coombs [facility] office before any ordered. Weight check in 8 weeks. Office visit in 6 months.

## 2017-02-03 NOTE — Patient Instructions (Addendum)
Physician will call with results of ultrasound when completed. Will request copy of blood work from Dr. Roe Coombs Diego's office before any ordered. Weight check in 8 weeks.

## 2017-02-09 ENCOUNTER — Ambulatory Visit (HOSPITAL_COMMUNITY)
Admission: RE | Admit: 2017-02-09 | Discharge: 2017-02-09 | Disposition: A | Discharge status: Home / Self Care | Payer: Medicare Other | Source: Ambulatory Visit | Attending: Internal Medicine | Admitting: Internal Medicine

## 2017-02-09 DIAGNOSIS — J90 Pleural effusion, not elsewhere classified: Secondary | ICD-10-CM | POA: Diagnosis not present

## 2017-02-09 DIAGNOSIS — K7031 Alcoholic cirrhosis of liver with ascites: Secondary | ICD-10-CM | POA: Insufficient documentation

## 2017-02-09 DIAGNOSIS — R9389 Abnormal findings on diagnostic imaging of other specified body structures: Secondary | ICD-10-CM | POA: Insufficient documentation

## 2017-02-09 DIAGNOSIS — K7030 Alcoholic cirrhosis of liver without ascites: Secondary | ICD-10-CM

## 2017-02-09 DIAGNOSIS — R634 Abnormal weight loss: Secondary | ICD-10-CM | POA: Insufficient documentation

## 2017-02-13 ENCOUNTER — Other Ambulatory Visit (INDEPENDENT_AMBULATORY_CARE_PROVIDER_SITE_OTHER): Payer: Self-pay | Admitting: *Deleted

## 2017-02-13 DIAGNOSIS — K7031 Alcoholic cirrhosis of liver with ascites: Secondary | ICD-10-CM

## 2017-02-13 DIAGNOSIS — R17 Unspecified jaundice: Secondary | ICD-10-CM

## 2017-02-13 DIAGNOSIS — D696 Thrombocytopenia, unspecified: Secondary | ICD-10-CM

## 2017-02-17 LAB — CBC
HCT: 32.3 % — ABNORMAL LOW (ref 38.5–50.0)
Hemoglobin: 11.4 g/dL — ABNORMAL LOW (ref 13.2–17.1)
MCH: 31.8 pg (ref 27.0–33.0)
MCHC: 35.3 g/dL (ref 32.0–36.0)
MCV: 90.2 fL (ref 80.0–100.0)
MPV: 10.2 fL (ref 7.5–12.5)
PLATELETS: 127 10*3/uL — AB (ref 140–400)
RBC: 3.58 10*6/uL — ABNORMAL LOW (ref 4.20–5.80)
RDW: 17.8 % — ABNORMAL HIGH (ref 11.0–15.0)
WBC: 6.4 10*3/uL (ref 3.8–10.8)

## 2017-02-17 LAB — AFP TUMOR MARKER: AFP-Tumor Marker: 4.2 ng/mL (ref <6.1)

## 2017-04-23 ENCOUNTER — Other Ambulatory Visit (INDEPENDENT_AMBULATORY_CARE_PROVIDER_SITE_OTHER): Payer: Self-pay | Admitting: Internal Medicine

## 2017-04-23 DIAGNOSIS — K7682 Hepatic encephalopathy: Secondary | ICD-10-CM

## 2017-04-23 DIAGNOSIS — K7290 Hepatic failure, unspecified without coma: Secondary | ICD-10-CM

## 2017-08-04 ENCOUNTER — Ambulatory Visit (INDEPENDENT_AMBULATORY_CARE_PROVIDER_SITE_OTHER): Payer: Medicare Other | Admitting: Internal Medicine

## 2017-08-04 ENCOUNTER — Encounter (INDEPENDENT_AMBULATORY_CARE_PROVIDER_SITE_OTHER): Payer: Self-pay | Admitting: *Deleted

## 2017-08-04 ENCOUNTER — Encounter (INDEPENDENT_AMBULATORY_CARE_PROVIDER_SITE_OTHER): Payer: Self-pay | Admitting: Internal Medicine

## 2017-08-04 VITALS — BP 126/70 | HR 74 | Temp 97.1°F | Resp 18 | Ht 68.0 in | Wt 149.7 lb

## 2017-08-04 DIAGNOSIS — K7290 Hepatic failure, unspecified without coma: Secondary | ICD-10-CM

## 2017-08-04 DIAGNOSIS — K7031 Alcoholic cirrhosis of liver with ascites: Secondary | ICD-10-CM

## 2017-08-04 DIAGNOSIS — K7682 Hepatic encephalopathy: Secondary | ICD-10-CM

## 2017-08-04 NOTE — Patient Instructions (Addendum)
Will request copy of recent blood work from Dr. Otilio Saberondiego's office. Abdominal ultrasound to be scheduled in near future. Physician will call with results of blood test and ultrasound when completed. Notify if you gain more than 5 pounds.

## 2017-08-04 NOTE — Progress Notes (Signed)
Presenting complaint;  Follow-up for chronic liver disease.  Database and subjective:  Patient is 77 year old Caucasian male who has alcoholic cirrhosis complicated by ascites and hepatic encephalopathy who is here for scheduled visit.  He was last seen 6 months ago.  He is accompanied by his wife.  His wife states he had blood work by Dr. Janna Archondiego few weeks ago.  Patient has no complaints. He continues to abstain from drinking alcohol.  He denies nausea vomiting abdominal pain melena or rectal bleeding. His appetite is good.  He has gained 4 pounds since his last visit.  He does not feel his abdomen is becoming distended like it has been in the past.   Current Medications: Outpatient Encounter Medications as of 08/04/2017  Medication Sig  . Cholecalciferol (VITAMIN D3 PO) Take 1,000 Units by mouth daily.  . CONSTULOSE 10 GM/15ML solution TAKE 15 ML BY MOUTH THREE TIMES DAILY  . Multiple Vitamins-Minerals (MULTIVITAL) tablet Take 1 tablet by mouth daily.  Marland Kitchen. oxyCODONE (ROXICODONE) 5 MG immediate release tablet Take 0.5 tablets (2.5 mg total) by mouth every 4 (four) hours as needed for pain.   No facility-administered encounter medications on file as of 08/04/2017.      Objective: Blood pressure 126/70, pulse 74, temperature (!) 97.1 F (36.2 C), temperature source Oral, resp. rate 18, height 5\' 8"  (1.727 m), weight 149 lb 11.2 oz (67.9 kg). Patient is alert and in no acute distress. He does not have asterixis. Conjunctiva is pink. Sclera is nonicteric Oropharyngeal mucosa is normal. No neck masses or thyromegaly noted. Cardiac exam with regular rhythm normal S1 and S2. No murmur or gallop noted. Lungs are clear to auscultation. Abdomenit is full.  He has umbilical hernia which is completely reducible.  Abdomen is soft and nontender without organomegaly or masses.  Shifting dullness absent. No LE edema or clubbing noted.  Labs/studies Results: Lab data from 06/02/2017 WBC 7.0.  H&H  10.7 and 31.5 and platelet count 50 7K. Bilirubin 2.4, AP 61, AST 31, ALT 16 and albumin 2.9.  Serum calcium 8.2.  Glucose 85, BUN 9, creatinine 1.01 serum sodium 141, potassium 3.8, chloride 108 and CO2 25.  Abdominal ultrasound on 02/09/2017 revealed cirrhotic liver gallbladder wall thickening without cholelithiasis.  Small ascites.  Alpha-fetoprotein on 02/13/2017 was 4.2.  Assessment:  #1.  Alcoholic cirrhosis.  He has minimal ascites.  Presently he is on a low-salt diet.  No indication for diuretic therapy at this time.  [facility]  Patient has declined to be screened for esophageal varices in the past.  #2.  Hepatic encephalopathy.  He is doing well with lactulose.  #3.  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  #4. CRC screening.  He has declined screening and therefore he is never been screened.  Plan:  Continue lactulose at present dose.   Patient will go to the lab for alpha-fetoprotein.   Will schedule patient for Right upper quadrant abdominal ultrasound.  Patient advised to Check his weight every week and he should call office if he has gained more than 5 pounds.   Office visit in 6 months.

## 2017-08-05 LAB — AFP TUMOR MARKER: AFP-Tumor Marker: 3.6 ng/mL (ref <6.1)

## 2017-08-07 ENCOUNTER — Ambulatory Visit (HOSPITAL_COMMUNITY): Payer: Medicare Other

## 2017-08-11 ENCOUNTER — Ambulatory Visit (HOSPITAL_COMMUNITY)
Admission: RE | Admit: 2017-08-11 | Discharge: 2017-08-11 | Disposition: A | Discharge status: Home / Self Care | Payer: Medicare Other | Source: Ambulatory Visit | Attending: Internal Medicine | Admitting: Internal Medicine

## 2017-08-11 DIAGNOSIS — J90 Pleural effusion, not elsewhere classified: Secondary | ICD-10-CM | POA: Diagnosis not present

## 2017-08-11 DIAGNOSIS — K7031 Alcoholic cirrhosis of liver with ascites: Secondary | ICD-10-CM

## 2017-09-29 ENCOUNTER — Other Ambulatory Visit (INDEPENDENT_AMBULATORY_CARE_PROVIDER_SITE_OTHER): Payer: Self-pay | Admitting: Internal Medicine

## 2017-09-29 DIAGNOSIS — K7682 Hepatic encephalopathy: Secondary | ICD-10-CM

## 2017-09-29 DIAGNOSIS — K7290 Hepatic failure, unspecified without coma: Secondary | ICD-10-CM

## 2017-11-16 ENCOUNTER — Other Ambulatory Visit: Payer: Self-pay

## 2017-11-16 ENCOUNTER — Inpatient Hospital Stay (HOSPITAL_COMMUNITY)
Admission: EM | Admit: 2017-11-16 | Discharge: 2017-11-21 | DRG: 432 | Disposition: A | Discharge status: Home Health | Payer: Medicare Other | Attending: Family Medicine | Admitting: Family Medicine

## 2017-11-16 ENCOUNTER — Encounter (HOSPITAL_COMMUNITY): Payer: Self-pay

## 2017-11-16 ENCOUNTER — Emergency Department (HOSPITAL_COMMUNITY): Payer: Medicare Other

## 2017-11-16 ENCOUNTER — Telehealth (INDEPENDENT_AMBULATORY_CARE_PROVIDER_SITE_OTHER): Payer: Self-pay | Admitting: *Deleted

## 2017-11-16 DIAGNOSIS — K922 Gastrointestinal hemorrhage, unspecified: Secondary | ICD-10-CM | POA: Diagnosis not present

## 2017-11-16 DIAGNOSIS — Z79891 Long term (current) use of opiate analgesic: Secondary | ICD-10-CM

## 2017-11-16 DIAGNOSIS — I4581 Long QT syndrome: Secondary | ICD-10-CM | POA: Diagnosis present

## 2017-11-16 DIAGNOSIS — E43 Unspecified severe protein-calorie malnutrition: Secondary | ICD-10-CM | POA: Diagnosis present

## 2017-11-16 DIAGNOSIS — K7290 Hepatic failure, unspecified without coma: Secondary | ICD-10-CM | POA: Diagnosis present

## 2017-11-16 DIAGNOSIS — D62 Acute posthemorrhagic anemia: Secondary | ICD-10-CM | POA: Diagnosis present

## 2017-11-16 DIAGNOSIS — K921 Melena: Secondary | ICD-10-CM

## 2017-11-16 DIAGNOSIS — Z6822 Body mass index (BMI) 22.0-22.9, adult: Secondary | ICD-10-CM

## 2017-11-16 DIAGNOSIS — D696 Thrombocytopenia, unspecified: Secondary | ICD-10-CM | POA: Diagnosis present

## 2017-11-16 DIAGNOSIS — K766 Portal hypertension: Secondary | ICD-10-CM | POA: Diagnosis present

## 2017-11-16 DIAGNOSIS — K429 Umbilical hernia without obstruction or gangrene: Secondary | ICD-10-CM | POA: Diagnosis present

## 2017-11-16 DIAGNOSIS — K449 Diaphragmatic hernia without obstruction or gangrene: Secondary | ICD-10-CM | POA: Diagnosis present

## 2017-11-16 DIAGNOSIS — I8511 Secondary esophageal varices with bleeding: Secondary | ICD-10-CM | POA: Diagnosis present

## 2017-11-16 DIAGNOSIS — E8809 Other disorders of plasma-protein metabolism, not elsewhere classified: Secondary | ICD-10-CM | POA: Diagnosis present

## 2017-11-16 DIAGNOSIS — K7030 Alcoholic cirrhosis of liver without ascites: Secondary | ICD-10-CM | POA: Diagnosis not present

## 2017-11-16 DIAGNOSIS — Z7189 Other specified counseling: Secondary | ICD-10-CM | POA: Diagnosis not present

## 2017-11-16 DIAGNOSIS — D649 Anemia, unspecified: Secondary | ICD-10-CM

## 2017-11-16 DIAGNOSIS — Z79899 Other long term (current) drug therapy: Secondary | ICD-10-CM

## 2017-11-16 DIAGNOSIS — Z515 Encounter for palliative care: Secondary | ICD-10-CM | POA: Diagnosis not present

## 2017-11-16 DIAGNOSIS — D509 Iron deficiency anemia, unspecified: Secondary | ICD-10-CM | POA: Diagnosis present

## 2017-11-16 DIAGNOSIS — K259 Gastric ulcer, unspecified as acute or chronic, without hemorrhage or perforation: Secondary | ICD-10-CM | POA: Diagnosis present

## 2017-11-16 DIAGNOSIS — E876 Hypokalemia: Secondary | ICD-10-CM | POA: Diagnosis not present

## 2017-11-16 DIAGNOSIS — D500 Iron deficiency anemia secondary to blood loss (chronic): Secondary | ICD-10-CM | POA: Diagnosis present

## 2017-11-16 DIAGNOSIS — E778 Other disorders of glycoprotein metabolism: Secondary | ICD-10-CM | POA: Diagnosis present

## 2017-11-16 DIAGNOSIS — K7040 Alcoholic hepatic failure without coma: Principal | ICD-10-CM | POA: Diagnosis present

## 2017-11-16 DIAGNOSIS — J90 Pleural effusion, not elsewhere classified: Secondary | ICD-10-CM | POA: Diagnosis present

## 2017-11-16 DIAGNOSIS — I8500 Esophageal varices without bleeding: Secondary | ICD-10-CM | POA: Diagnosis not present

## 2017-11-16 DIAGNOSIS — Z66 Do not resuscitate: Secondary | ICD-10-CM | POA: Diagnosis not present

## 2017-11-16 DIAGNOSIS — K7031 Alcoholic cirrhosis of liver with ascites: Secondary | ICD-10-CM | POA: Diagnosis present

## 2017-11-16 DIAGNOSIS — K7200 Acute and subacute hepatic failure without coma: Secondary | ICD-10-CM | POA: Diagnosis not present

## 2017-11-16 DIAGNOSIS — K3189 Other diseases of stomach and duodenum: Secondary | ICD-10-CM | POA: Diagnosis present

## 2017-11-16 DIAGNOSIS — K7682 Hepatic encephalopathy: Secondary | ICD-10-CM | POA: Diagnosis present

## 2017-11-16 HISTORY — DX: Thrombocytopenia, unspecified: D69.6

## 2017-11-16 HISTORY — DX: Hepatic failure, unspecified without coma: K72.90

## 2017-11-16 HISTORY — DX: Hepatic encephalopathy: K76.82

## 2017-11-16 HISTORY — DX: Unspecified jaundice: R17

## 2017-11-16 HISTORY — DX: Anemia, unspecified: D64.9

## 2017-11-16 LAB — COMPREHENSIVE METABOLIC PANEL
ALT: 18 U/L (ref 0–44)
ANION GAP: 9 (ref 5–15)
AST: 41 U/L (ref 15–41)
Albumin: 2.7 g/dL — ABNORMAL LOW (ref 3.5–5.0)
Alkaline Phosphatase: 67 U/L (ref 38–126)
BUN: 13 mg/dL (ref 8–23)
CHLORIDE: 110 mmol/L (ref 98–111)
CO2: 21 mmol/L — AB (ref 22–32)
Calcium: 8 mg/dL — ABNORMAL LOW (ref 8.9–10.3)
Creatinine, Ser: 0.87 mg/dL (ref 0.61–1.24)
Glucose, Bld: 105 mg/dL — ABNORMAL HIGH (ref 70–99)
Potassium: 3.4 mmol/L — ABNORMAL LOW (ref 3.5–5.1)
Sodium: 140 mmol/L (ref 135–145)
Total Bilirubin: 2.7 mg/dL — ABNORMAL HIGH (ref 0.3–1.2)
Total Protein: 6.5 g/dL (ref 6.5–8.1)

## 2017-11-16 LAB — RAPID URINE DRUG SCREEN, HOSP PERFORMED
Amphetamines: NOT DETECTED
BARBITURATES: NOT DETECTED
Benzodiazepines: NOT DETECTED
COCAINE: NOT DETECTED
OPIATES: POSITIVE — AB
TETRAHYDROCANNABINOL: NOT DETECTED

## 2017-11-16 LAB — PROTIME-INR
INR: 2.2
Prothrombin Time: 24.2 seconds — ABNORMAL HIGH (ref 11.4–15.2)

## 2017-11-16 LAB — URINALYSIS, ROUTINE W REFLEX MICROSCOPIC
Bacteria, UA: NONE SEEN
Bilirubin Urine: NEGATIVE
GLUCOSE, UA: NEGATIVE mg/dL
Ketones, ur: 5 mg/dL — AB
Leukocytes, UA: NEGATIVE
NITRITE: NEGATIVE
PH: 6 (ref 5.0–8.0)
PROTEIN: 30 mg/dL — AB
RBC / HPF: >50 RBC/hpf — ABNORMAL HIGH (ref 0–5)
Specific Gravity, Urine: 1.025 (ref 1.005–1.030)

## 2017-11-16 LAB — CBC
HCT: 27.3 % — ABNORMAL LOW (ref 39.0–52.0)
HEMOGLOBIN: 8.5 g/dL — AB (ref 13.0–17.0)
MCH: 27.8 pg (ref 26.0–34.0)
MCHC: 31.1 g/dL (ref 30.0–36.0)
MCV: 89.2 fL (ref 78.0–100.0)
Platelets: 138 10*3/uL — ABNORMAL LOW (ref 150–400)
RBC: 3.06 MIL/uL — AB (ref 4.22–5.81)
RDW: 25.4 % — ABNORMAL HIGH (ref 11.5–15.5)
WBC: 10.6 10*3/uL — AB (ref 4.0–10.5)

## 2017-11-16 LAB — RETICULOCYTES
RBC.: 3.05 MIL/uL — ABNORMAL LOW (ref 4.22–5.81)
RETIC COUNT ABSOLUTE: 140.3 10*3/uL (ref 19.0–186.0)
Retic Ct Pct: 4.6 % — ABNORMAL HIGH (ref 0.4–3.1)

## 2017-11-16 LAB — IRON AND TIBC
Iron: 65 ug/dL (ref 45–182)
Saturation Ratios: 18 % (ref 17.9–39.5)
TIBC: 356 ug/dL (ref 250–450)
UIBC: 291 ug/dL

## 2017-11-16 LAB — CBG MONITORING, ED
Glucose-Capillary: 100 mg/dL — ABNORMAL HIGH (ref 70–99)
Glucose-Capillary: 90 mg/dL (ref 70–99)

## 2017-11-16 LAB — AMMONIA: Ammonia: 55 umol/L — ABNORMAL HIGH (ref 9–35)

## 2017-11-16 LAB — DIFFERENTIAL
BASOS ABS: 0 10*3/uL (ref 0.0–0.1)
BASOS PCT: 0 %
Eosinophils Absolute: 0.6 10*3/uL (ref 0.0–0.7)
Eosinophils Relative: 6 %
LYMPHS ABS: 2.3 10*3/uL (ref 0.7–4.0)
Lymphocytes Relative: 21 %
MONOS PCT: 10 %
Monocytes Absolute: 1 10*3/uL (ref 0.1–1.0)
NEUTROS ABS: 6.8 10*3/uL (ref 1.7–7.7)
NEUTROS PCT: 63 %

## 2017-11-16 LAB — MRSA PCR SCREENING: MRSA BY PCR: NEGATIVE

## 2017-11-16 LAB — MAGNESIUM: MAGNESIUM: 1.9 mg/dL (ref 1.7–2.4)

## 2017-11-16 LAB — FERRITIN: FERRITIN: 27 ng/mL (ref 24–336)

## 2017-11-16 LAB — TROPONIN I: Troponin I: <0.03 ng/mL (ref <0.03)

## 2017-11-16 LAB — ETHANOL: Alcohol, Ethyl (B): <10 mg/dL (ref <10)

## 2017-11-16 LAB — POC OCCULT BLOOD, ED: FECAL OCCULT BLD: POSITIVE — AB

## 2017-11-16 LAB — VITAMIN B12: Vitamin B-12: 2646 pg/mL — ABNORMAL HIGH (ref 180–914)

## 2017-11-16 LAB — FOLATE: Folate: 20.3 ng/mL (ref >5.9)

## 2017-11-16 LAB — PREPARE RBC (CROSSMATCH)

## 2017-11-16 MED ORDER — ORAL CARE MOUTH RINSE
15.0000 mL | Freq: Two times a day (BID) | OROMUCOSAL | Status: DC
  Administered 2017-11-17 – 2017-11-21 (×9): 15 mL via OROMUCOSAL

## 2017-11-16 MED ORDER — SODIUM CHLORIDE 0.9 % IV SOLN
25.0000 ug/h | INTRAVENOUS | Status: DC
  Administered 2017-11-16 – 2017-11-18 (×3): 25 ug/h via INTRAVENOUS
  Administered 2017-11-18: 50 ug/h via INTRAVENOUS
  Administered 2017-11-19: 25 ug/h via INTRAVENOUS
  Filled 2017-11-16 (×11): qty 1

## 2017-11-16 MED ORDER — ONDANSETRON HCL 4 MG PO TABS: 4.0000 mg | ORAL_TABLET | Freq: Four times a day (QID) | ORAL | Status: DC | PRN

## 2017-11-16 MED ORDER — VITAMIN B-1 100 MG PO TABS
100.0000 mg | ORAL_TABLET | Freq: Every day | ORAL | Status: DC
  Administered 2017-11-16 – 2017-11-21 (×6): 100 mg via ORAL
  Filled 2017-11-16 (×7): qty 1

## 2017-11-16 MED ORDER — ALBUTEROL SULFATE HFA 108 (90 BASE) MCG/ACT IN AERS: 2.0000 | INHALATION_SPRAY | Freq: Four times a day (QID) | RESPIRATORY_TRACT | Status: DC

## 2017-11-16 MED ORDER — ALBUTEROL SULFATE (2.5 MG/3ML) 0.083% IN NEBU
2.5000 mg | INHALATION_SOLUTION | Freq: Four times a day (QID) | RESPIRATORY_TRACT | Status: DC
  Filled 2017-11-16: qty 3

## 2017-11-16 MED ORDER — SODIUM CHLORIDE 0.9% FLUSH
3.0000 mL | Freq: Two times a day (BID) | INTRAVENOUS | Status: DC
  Administered 2017-11-17 – 2017-11-21 (×9): 3 mL via INTRAVENOUS

## 2017-11-16 MED ORDER — SODIUM CHLORIDE 0.9% FLUSH: 3.0000 mL | INTRAVENOUS | Status: DC | PRN

## 2017-11-16 MED ORDER — ONDANSETRON HCL 4 MG/2ML IJ SOLN: 4.0000 mg | Freq: Four times a day (QID) | INTRAMUSCULAR | Status: DC | PRN

## 2017-11-16 MED ORDER — ADULT MULTIVITAMIN W/MINERALS CH
1.0000 | ORAL_TABLET | Freq: Every day | ORAL | Status: DC
  Administered 2017-11-16 – 2017-11-21 (×6): 1 via ORAL
  Filled 2017-11-16 (×6): qty 1

## 2017-11-16 MED ORDER — FOLIC ACID 1 MG PO TABS
1.0000 mg | ORAL_TABLET | Freq: Every day | ORAL | Status: DC
  Administered 2017-11-16 – 2017-11-21 (×6): 1 mg via ORAL
  Filled 2017-11-16 (×6): qty 1

## 2017-11-16 MED ORDER — LACTULOSE 10 GM/15ML PO SOLN
20.0000 g | Freq: Three times a day (TID) | ORAL | Status: DC
  Administered 2017-11-16 – 2017-11-19 (×8): 20 g via ORAL
  Filled 2017-11-16 (×8): qty 30

## 2017-11-16 MED ORDER — SODIUM CHLORIDE 0.9% IV SOLUTION
Freq: Once | INTRAVENOUS | Status: AC
  Administered 2017-11-16: 19:00:00 via INTRAVENOUS

## 2017-11-16 MED ORDER — ALBUTEROL SULFATE (2.5 MG/3ML) 0.083% IN NEBU
2.5000 mg | INHALATION_SOLUTION | RESPIRATORY_TRACT | Status: DC | PRN
Start: 2017-11-16 — End: 2017-11-21

## 2017-11-16 MED ORDER — SODIUM CHLORIDE 0.9 % IV SOLN
250.0000 mL | INTRAVENOUS | Status: DC | PRN
  Administered 2017-11-17: 250 mL via INTRAVENOUS

## 2017-11-16 NOTE — Progress Notes (Signed)
Pt states that he does not use an inhaler at home and has never smoked. Wife is at bedside and states that he only uses the inhaler as needed and agrees that he has never smoked. Pt lung sounds are clear and pt is on room air with O2 saturation of 98%. Pt doesn't wish to take a treatment at this time and family is in agreement

## 2017-11-16 NOTE — ED Notes (Signed)
EDP at bedside  

## 2017-11-16 NOTE — ED Triage Notes (Addendum)
Pt brought in by family. Wife reports that he woke up and very confused. Pt woke up approx 9 and wife noticed he was unsteady. Wife stating asking for pillow to wash his face. Pt unable to tell date, but able to state his name DOB, and [place]. Dr Mikey Collegeehmen wanted his ammonia checked. Wife reports patient has been drinking beer off an on

## 2017-11-16 NOTE — ED Notes (Signed)
Wife Eunice Blase(Debbie) has pts clothes, medications and shoes.

## 2017-11-16 NOTE — ED Provider Notes (Signed)
[facility] EMERGENCY DEPARTMENT Provider Note   CSN: [id number] Arrival date & time: 11/16/17  1220     History   Chief Complaint Chief Complaint  Patient presents with  . Altered Mental Status    HPI Bradley Beltran. is a 77 y.o. male.  The history is provided by the patient, the spouse and a relative. The history is limited by the condition of the patient (confusion).  Altered Mental Status      Pt was seen at 1340. Per pt and his family: Pt's wife states pt has been confused since this morning when he woke up. Pt asked his wife "for a pillow to wash his face." Pt has been unsteady on his feet since yesterday. Pt told his wife that he has been taking his meds as rx, but wife does not think he took his lactulose yesterday. Pt did take a dose today. Pt told his wife that he fell very early yesterday morning while walking to the bathroom, but she did not hear him fall and believes she would have heard it. Pt apparently has again started to drink etoh. Pt's family called pt's GI Dr. Karilyn Beltran, and was told to come to the ED for further evaluation. Pt currently denies any complaints. Denies CP/SOB, no abd pain, no N/V/D, no fevers, no focal motor weakness.    Past Medical History:  Diagnosis Date  . Anemia   . Cirrhosis with alcoholism (HCC)   . Elevated bilirubin   . Hepatic encephalopathy (HCC)   . Thrombocytopenia [facility])     Patient Active Problem List   Diagnosis Date Noted  . Alcoholic cirrhosis (HCC) 09/21/2012  . Ascites 09/06/2012  . Elevated bilirubin 09/06/2012  . Thrombocytopenia, unspecified (HCC) 09/06/2012  . Fracture of ankle, trimalleolar, right, closed 06/09/2012    Past Surgical History:  Procedure Laterality Date  . neck fx     2005  . ORIF ANKLE FRACTURE Right 06/09/2012   Procedure: OPEN REDUCTION INTERNAL FIXATION (ORIF) ANKLE FRACTURE;  Surgeon: Bradley Hearing, MD;  Location: [facility];  Service: Orthopedics;  Laterality: Right;        Home  Medications    Prior to Admission medications   Medication Sig Start Date End Date Taking? Authorizing Provider  albuterol (PROVENTIL HFA;VENTOLIN HFA) 108 (90 Base) MCG/ACT inhaler Inhale 2 puffs into the lungs 4 (four) times daily. 09/07/17   [provider]  Cholecalciferol (VITAMIN D3 PO) Take 1,000 Units by mouth daily.    [provider]  CONSTULOSE 10 GM/15ML solution TAKE 15 ML BY MOUTH THREE TIMES DAILY 09/29/17   Setzer, Brand Males, NP  Multiple Vitamins-Minerals (MULTIVITAL) tablet Take 1 tablet by mouth daily.    [provider]  oxyCODONE (ROXICODONE) 5 MG immediate release tablet Take 0.5 tablets (2.5 mg total) by mouth every 4 (four) hours as needed for pain. 09/28/12   Setzer, Brand Males, NP    Family History No family history on file.  Social History Social History   Tobacco Use  . Smoking status: Never Smoker  . Smokeless tobacco: Never Used  Substance Use Topics  . Alcohol use: No    Alcohol/week: 0.0 oz    Comment: NO etoh since February, 2014  . Drug use: No     Allergies   Patient has no known allergies.   Review of Systems Review of Systems  Unable to perform ROS: Mental status change     Physical Exam Updated Vital Signs BP 135/82  Pulse 91   Temp 98.1 F (36.7 C) (Oral)   Resp 14   Wt 67.6 kg (149 lb)   SpO2 97%   BMI 22.66 kg/m    Patient Vitals for the past 24 hrs:  BP Temp Temp src Pulse Resp SpO2 Weight  11/16/17 1400 138/73 - - 99 17 96 % -  11/16/17 1349 - - - 91 14 97 % -  11/16/17 1338 135/82 - - - 19 - -  11/16/17 1238 (!) 142/76 98.1 F (36.7 C) Oral (!) 114 20 94 % 67.6 kg (149 lb)   14:50 Orthostatic Vital Signs TH  Orthostatic Lying   BP- Lying: 140/74   Pulse- Lying: 92       Orthostatic Sitting  BP- Sitting: 133/78   Pulse- Sitting: 98       Orthostatic Standing at 0 minutes  BP- Standing at 0 minutes: 133/114Abnormal    Pulse- Standing at 0 minutes: 116      Physical  Exam 1345: Physical examination:  Nursing notes reviewed; Vital signs and O2 SAT reviewed;  Constitutional: Well developed, Well nourished, Well hydrated, In no acute distress; Head:  Normocephalic, atraumatic; Eyes: EOMI, PERRL, +scleral icterus; ENMT: Mouth and pharynx normal, Mucous membranes moist; Neck: Supple, Full range of motion, No lymphadenopathy; Cardiovascular: Regular rate and rhythm, No gallop; Respiratory: Breath sounds clear & equal bilaterally, No wheezes.  Speaking full sentences with ease, Normal respiratory effort/excursion; Chest: Nontender, Movement normal; Abdomen: Soft, Nontender, Nondistended, Normal bowel sounds. Rectal exam performed w/permission of pt and ED RN chaperone present.  Anal tone normal.  Non-tender, soft black stool in rectal vault, heme positive.  No fissures, no external hemorrhoids, no palp masses.;;; Genitourinary: No CVA tenderness; Extremities: Peripheral pulses normal, No tenderness, No edema, No calf edema or asymmetry.; Neuro: Awake, alert, confused re: events. Laughing at times. No facial droop. Speech clear. Grips equal. Strength 5/5 equal bilat UE's and LE's. Pt moves all extremities spontaneously and to command without apparent gross focal motor deficits.; Skin: Color jaundiced, Warm, Dry.   ED Treatments / Results  Labs (all labs ordered are listed, but only abnormal results are displayed)   EKG EKG Interpretation  Date/Time:  Monday November 16 2017 13:39:37 EDT Ventricular Rate:  93 PR Interval:    QRS Duration: 75 QT Interval:  470 QTC Calculation: 585 R Axis:   1 Text Interpretation:  Sinus rhythm Low voltage, extremity and precordial leads Nonspecific T abnormalities, lateral leads Prolonged QT interval Baseline wander When compared with ECG of 09/03/2012 QT has lengthened Otherwise no significant change Confirmed by Bradley Beltran [phone]) on 11/16/2017 1:49:03 PM   Radiology   Procedures Procedures (including critical care  time)  Medications Ordered in ED Medications - No data to display   Initial Impression / Assessment and Plan / ED Course  I have reviewed the triage vital signs and the nursing notes.  Pertinent labs & imaging results that were available during my care of the patient were reviewed by me and considered in my medical decision making (see chart for details).  MDM Reviewed: previous chart, nursing note and vitals Reviewed previous: labs and ECG Interpretation: labs, ECG, x-ray and CT scan Total time providing critical care: 30-74 minutes. This excludes time spent performing separately reportable procedures and services. Consults: gastrointestinal and admitting MD   CRITICAL CARE Performed by: Bradley Beltran Total critical care time: 35 minutes Critical care time was exclusive of separately billable procedures and treating other patients. Critical care was  necessary to treat or prevent imminent or life-threatening deterioration. Critical care was time spent personally by me on the following activities: development of treatment plan with patient and/or surrogate as well as nursing, discussions with consultants, evaluation of patient's response to treatment, examination of patient, obtaining history from patient or surrogate, ordering and performing treatments and interventions, ordering and review of laboratory studies, ordering and review of radiographic studies, pulse oximetry and re-evaluation of patient's condition.   Results for orders placed or performed during the hospital encounter of 11/16/17  Comprehensive metabolic panel  Result Value Ref Range   Sodium 140 135 - 145 mmol/L   Potassium 3.4 (L) 3.5 - 5.1 mmol/L   Chloride 110 98 - 111 mmol/L   CO2 21 (L) 22 - 32 mmol/L   Glucose, Bld 105 (H) 70 - 99 mg/dL   BUN 13 8 - 23 mg/dL   Creatinine, Ser 1.30 0.61 - 1.24 mg/dL   Calcium 8.0 (L) 8.9 - 10.3 mg/dL   Total Protein 6.5 6.5 - 8.1 g/dL   Albumin 2.7 (L) 3.5 - 5.0 g/dL    AST 41 15 - 41 U/L   ALT 18 0 - 44 U/L   Alkaline Phosphatase 67 38 - 126 U/L   Total Bilirubin 2.7 (H) 0.3 - 1.2 mg/dL   GFR calc non Af Amer >60 >60 mL/min   GFR calc Af Amer >60 >60 mL/min   Anion gap 9 5 - 15  CBC  Result Value Ref Range   WBC 10.6 (H) 4.0 - 10.5 K/uL   RBC 3.06 (L) 4.22 - 5.81 MIL/uL   Hemoglobin 8.5 (L) 13.0 - 17.0 g/dL   HCT 86.5 (L) 78.4 - 69.6 %   MCV 89.2 78.0 - 100.0 fL   MCH 27.8 26.0 - 34.0 pg   MCHC 31.1 30.0 - 36.0 g/dL   RDW 29.5 (H) 28.4 - 13.2 %   Platelets 138 (L) 150 - 400 K/uL  Ammonia  Result Value Ref Range   Ammonia 55 (H) 9 - 35 umol/L  Differential  Result Value Ref Range   Neutrophils Relative % 63 %   Neutro Abs 6.8 1.7 - 7.7 K/uL   Lymphocytes Relative 21 %   Lymphs Abs 2.3 0.7 - 4.0 K/uL   Monocytes Relative 10 %   Monocytes Absolute 1.0 0.1 - 1.0 K/uL   Eosinophils Relative 6 %   Eosinophils Absolute 0.6 0.0 - 0.7 K/uL   Basophils Relative 0 %   Basophils Absolute 0.0 0.0 - 0.1 K/uL  Protime-INR  Result Value Ref Range   Prothrombin Time 24.2 (H) 11.4 - 15.2 seconds   INR 2.20   Urinalysis, Routine w reflex microscopic  Result Value Ref Range   Color, Urine AMBER (A) YELLOW   APPearance CLEAR CLEAR   Specific Gravity, Urine 1.025 1.005 - 1.030   pH 6.0 5.0 - 8.0   Glucose, UA NEGATIVE NEGATIVE mg/dL   Hgb urine dipstick SMALL (A) NEGATIVE   Bilirubin Urine NEGATIVE NEGATIVE   Ketones, ur 5 (A) NEGATIVE mg/dL   Protein, ur 30 (A) NEGATIVE mg/dL   Nitrite NEGATIVE NEGATIVE   Leukocytes, UA NEGATIVE NEGATIVE   RBC / HPF >50 (H) 0 - 5 RBC/hpf   WBC, UA 0-5 0 - 5 WBC/hpf   Bacteria, UA NONE SEEN NONE SEEN   Squamous Epithelial / LPF 0-5 0 - 5   Mucus PRESENT   Troponin I  Result Value Ref Range   Troponin I <0.03 <0.03  ng/mL  Urine rapid drug screen (hosp performed)  Result Value Ref Range   Opiates POSITIVE (A) NONE DETECTED   Cocaine NONE DETECTED NONE DETECTED   Benzodiazepines NONE DETECTED NONE DETECTED    Amphetamines NONE DETECTED NONE DETECTED   Tetrahydrocannabinol NONE DETECTED NONE DETECTED   Barbiturates NONE DETECTED NONE DETECTED  Magnesium  Result Value Ref Range   Magnesium 1.9 1.7 - 2.4 mg/dL  CBG monitoring, ED  Result Value Ref Range   Glucose-Capillary 100 (H) 70 - 99 mg/dL  CBG monitoring, ED  Result Value Ref Range   Glucose-Capillary 90 70 - 99 mg/dL  POC occult blood, ED  Result Value Ref Range   Fecal Occult Bld POSITIVE (A) NEGATIVE  Type and screen [facility]  Result Value Ref Range   ABO/RH(D) A POS    Antibody Screen NEG    Sample Expiration      11/19/2017 Performed at [facility], [address], [place], [place] [address]    Dg Chest 2 View Result Date: 11/16/2017 CLINICAL DATA:  Confusion EXAM: CHEST - 2 VIEW COMPARISON:  09/03/2012 FINDINGS: Small bilateral pleural effusions. Left greater than right bibasilar airspace disease. Normal heart size. No pneumothorax. IMPRESSION: Bilateral pleural effusions with bibasilar consolidations which may reflect atelectasis or pneumonia. Electronically Signed   By: Jasmine Pang M.D.   On: 11/16/2017 14:32   Ct Head Wo Contrast Result Date: 11/16/2017 CLINICAL DATA:  Altered mental status. EXAM: CT HEAD WITHOUT CONTRAST CT CERVICAL SPINE WITHOUT CONTRAST TECHNIQUE: Multidetector CT imaging of the head and cervical spine was performed following the standard protocol without intravenous contrast. Multiplanar CT image reconstructions of the cervical spine were also generated. COMPARISON:  CT scan of August 08, 2004. FINDINGS: CT HEAD FINDINGS Brain: Mild diffuse cortical atrophy is noted. Mild chronic ischemic white matter disease is noted. No mass effect or midline shift is noted. Ventricular size is within normal limits. There is no evidence of mass lesion, hemorrhage or acute infarction. Vascular: No hyperdense vessel or unexpected calcification. Skull: Normal. Negative for fracture or focal lesion. Sinuses/Orbits: No  acute finding. Other: None. CT CERVICAL SPINE FINDINGS Alignment: Normal. Skull base and vertebrae: No acute fracture. No primary bone lesion or focal pathologic process. Soft tissues and spinal canal: No prevertebral fluid or swelling. No visible canal hematoma. Disc levels: There appears to be fusion of C3-4. Moderate degenerative disc disease is noted at C5-6 and C6-7. Upper chest: Large bilateral pleural effusions are noted. Other: Degenerative changes seen involving posterior facet joints bilaterally. IMPRESSION: Mild diffuse cortical atrophy. Mild chronic ischemic white matter disease. No acute intracranial abnormality seen. Multilevel degenerative disc disease. No acute abnormality seen in the cervical spine. Large bilateral pleural effusions are seen within the visualized lung apices. Chest radiograph is recommended for further evaluation. Electronically Signed   By: Lupita Raider, M.D.   On: 11/16/2017 15:06   Ct Cervical Spine Wo Contrast Result Date: 11/16/2017 CLINICAL DATA:  Altered mental status. EXAM: CT HEAD WITHOUT CONTRAST CT CERVICAL SPINE WITHOUT CONTRAST TECHNIQUE: Multidetector CT imaging of the head and cervical spine was performed following the standard protocol without intravenous contrast. Multiplanar CT image reconstructions of the cervical spine were also generated. COMPARISON:  CT scan of August 08, 2004. FINDINGS: CT HEAD FINDINGS Brain: Mild diffuse cortical atrophy is noted. Mild chronic ischemic white matter disease is noted. No mass effect or midline shift is noted. Ventricular size is within normal limits. There is no evidence of mass lesion,  hemorrhage or acute infarction. Vascular: No hyperdense vessel or unexpected calcification. Skull: Normal. Negative for fracture or focal lesion. Sinuses/Orbits: No acute finding. Other: None. CT CERVICAL SPINE FINDINGS Alignment: Normal. Skull base and vertebrae: No acute fracture. No primary bone lesion or focal pathologic process. Soft  tissues and spinal canal: No prevertebral fluid or swelling. No visible canal hematoma. Disc levels: There appears to be fusion of C3-4. Moderate degenerative disc disease is noted at C5-6 and C6-7. Upper chest: Large bilateral pleural effusions are noted. Other: Degenerative changes seen involving posterior facet joints bilaterally. IMPRESSION: Mild diffuse cortical atrophy. Mild chronic ischemic white matter disease. No acute intracranial abnormality seen. Multilevel degenerative disc disease. No acute abnormality seen in the cervical spine. Large bilateral pleural effusions are seen within the visualized lung apices. Chest radiograph is recommended for further evaluation. Electronically Signed   By: Lupita RaiderJames  Green Beltran, M.D.   On: 11/16/2017 15:06    Results for Bradley Beltran, Bradley Beltran. (MRN [id number]) as of 11/16/2017 14:02  Ref. Range 07/31/2014 10:47 01/30/2015 10:22 08/07/2015 09:54 02/13/2017 11:12 11/16/2017 13:01  Hemoglobin Latest Ref Range: 13.0 - 17.0 g/dL 91.413.3 78.213.3 95.613.6 21.311.4 (L) 8.5 (L)  HCT Latest Ref Range: 39.0 - 52.0 % 38.7 (L) 38.0 (L) 38.7 32.3 (L) 27.3 (L)     1420:  Tbili level and platelet count per baseline. H/H lower than previous with black, heme positive stool. No EGD found on my chart review. T/C returned from GI Dr. 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   1545:  VS remain stable. Remains NAD, resps easy, abd soft/NT, neuro exam unchanged. T/C returned from [facility] Dr. Onalee Huaavid, case discussed, including:  HPI, pertinent PM/SHx, VS/PE, dx testing, ED course and treatment:  Agreeable to admit.      Final Clinical Impressions(s) / ED Diagnoses   Final diagnoses:  None    ED Discharge Orders    None       Bradley JesterMcManus, Kaz Auld,  DO 11/18/17 2127

## 2017-11-16 NOTE — H&P (Signed)
History and Physical    Bradley Beltran. WUJ:[id number] DOB: [birth year] DOA: 11/16/2017  PCP: Oval Linsey, MD  Patient coming from:  home  Chief Complaint:  confusion  HPI: Bradley Beltran. is a 77 y.o. 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  She called Dr. Inge Rise office this morning who recommended him come to the emergency department to have his ammonia level checked.  His ammonia level is elevated at 55.  His wife states that he skipped his lactulose yesterday.  She denies any fevers.  Patient is mildly confused and in general can provide some answers but not all.  Supplemental information is provided by his wife.  He has not had any coughing for fevers.  He has not had any shortness of breath.  She denies him having any vomiting blood or any bright red blood per rectum but he has had melanotic stools.  Patient's hemoglobin has dropped from 10-8 in the last couple of months.  He is heme positive.  Patient is being referred for admission for also likely GI bleed.   Review of Systems: As per HPI otherwise 10 point review of systems negative.   Past Medical History:  Diagnosis Date  . Anemia   . Cirrhosis with alcoholism (HCC)   . Elevated bilirubin   . Hepatic encephalopathy (HCC)   . Thrombocytopenia (HCC)     Past Surgical History:  Procedure Laterality Date  . neck fx     2005  . ORIF ANKLE FRACTURE Right 06/09/2012   Procedure: OPEN REDUCTION INTERNAL FIXATION (ORIF) ANKLE FRACTURE;  Surgeon: Vickki Hearing, MD;  Location: [facility];  Service: Orthopedics;  Laterality: Right;     reports that he has never smoked. He has never used smokeless tobacco. He reports that he does not drink alcohol or use drugs.  No Known Allergies  No family history on file. no premature CAD  Prior to Admission medications   Medication Sig Start Date End Date Taking?  Authorizing Provider  albuterol (PROVENTIL HFA;VENTOLIN HFA) 108 (90 Base) MCG/ACT inhaler Inhale 2 puffs into the lungs 4 (four) times daily. 09/07/17  Yes [provider]  cholecalciferol (VITAMIN D) 1000 units tablet Take 1,000 Units by mouth daily.    Yes [provider]  CONSTULOSE 10 GM/15ML solution TAKE 15 ML BY MOUTH THREE TIMES DAILY 09/29/17  Yes Setzer, Terri L, NP  Multiple Vitamins-Minerals (MULTIVITAL) tablet Take 1 tablet by mouth daily. S.S.S Tonic   Yes [provider]  oxyCODONE (ROXICODONE) 5 MG immediate release tablet Take 0.5 tablets (2.5 mg total) by mouth every 4 (four) hours as needed for pain. Patient taking differently: Take 5 mg by mouth 2 (two) times daily.  09/28/12  Yes Len Blalock, NP    Physical Exam: Vitals:   11/16/17 1338 11/16/17 1349 11/16/17 1400 11/16/17 1500  BP: 135/82  138/73 (!) 147/75  Pulse:  91 99 93  Resp: 19 14 17 15   Temp:      TempSrc:      SpO2:  97% 96% 100%  Weight:          Constitutional: NAD, calm, comfortable, jaundiced Vitals:   11/16/17 1338 11/16/17 1349 11/16/17 1400 11/16/17 1500  BP: 135/82  138/73 (!) 147/75  Pulse:  91 99 93  Resp: 19 14 17 15   Temp:      TempSrc:  SpO2:  97% 96% 100%  Weight:       Eyes: PERRL, lids and conjunctivae normal ENMT: Mucous membranes are moist. Posterior pharynx clear of any exudate or lesions.Normal dentition.  Neck: normal, supple, no masses, no thyromegaly Respiratory: clear to auscultation bilaterally, no wheezing, no crackles. Normal respiratory effort. No accessory muscle use.  Cardiovascular: Regular rate and rhythm, no murmurs / rubs / gallops. No extremity edema. 2+ pedal pulses. No carotid bruits.  Abdomen: no tenderness, no masses palpated. No hepatosplenomegaly. Bowel sounds positive.  Musculoskeletal: no clubbing / cyanosis. No joint deformity upper and lower extremities. Good ROM, no contractures. Normal muscle tone.  Skin: no rashes,  lesions, ulcers. No induration Neurologic: CN 2-12 grossly intact. Sensation intact, DTR normal. Strength 5/5 in all 4.  Psychiatric: not Normal judgment and insight. Alert and oriented x 2. Normal mood.    Labs on Admission: I have personally reviewed following labs and imaging studies  CBC: Recent Labs  Lab 11/16/17 1301  WBC 10.6*  NEUTROABS 6.8  HGB 8.5*  HCT 27.3*  MCV 89.2  PLT 138*   Basic Metabolic Panel: Recent Labs  Lab 11/16/17 1301  NA 140  K 3.4*  CL 110  CO2 21*  GLUCOSE 105*  BUN 13  CREATININE 0.87  CALCIUM 8.0*  MG 1.9   GFR: Estimated Creatinine Clearance: 65.8 mL/min (by C-G formula based on SCr of 0.87 mg/dL). Liver Function Tests: Recent Labs  Lab 11/16/17 1301  AST 41  ALT 18  ALKPHOS 67  BILITOT 2.7*  PROT 6.5  ALBUMIN 2.7*   No results for input(s): LIPASE, AMYLASE in the last 168 hours. Recent Labs  Lab 11/16/17 1301  AMMONIA 55*   Coagulation Profile: Recent Labs  Lab 11/16/17 1301  INR 2.20   Cardiac Enzymes: Recent Labs  Lab 11/16/17 1301  TROPONINI <0.03   BNP (last 3 results) No results for input(s): PROBNP in the last 8760 hours. HbA1C: No results for input(s): HGBA1C in the last 72 hours. CBG: Recent Labs  Lab 11/16/17 1255 11/16/17 1337  GLUCAP 100* 90   Lipid Profile: No results for input(s): CHOL, HDL, LDLCALC, TRIG, CHOLHDL, LDLDIRECT in the last 72 hours. Thyroid Function Tests: No results for input(s): TSH, T4TOTAL, FREET4, T3FREE, THYROIDAB in the last 72 hours. Anemia Panel: No results for input(s): VITAMINB12, FOLATE, FERRITIN, TIBC, IRON, RETICCTPCT in the last 72 hours. Urine analysis:    Component Value Date/Time   COLORURINE AMBER (A) 11/16/2017 1405   APPEARANCEUR CLEAR 11/16/2017 1405   LABSPEC 1.025 11/16/2017 1405   PHURINE 6.0 11/16/2017 1405   GLUCOSEU NEGATIVE 11/16/2017 1405   HGBUR SMALL (A) 11/16/2017 1405   BILIRUBINUR NEGATIVE 11/16/2017 1405   KETONESUR 5 (A) 11/16/2017  1405   PROTEINUR 30 (A) 11/16/2017 1405   UROBILINOGEN 1.0 09/03/2012 1320   NITRITE NEGATIVE 11/16/2017 1405   LEUKOCYTESUR NEGATIVE 11/16/2017 1405   Sepsis Labs: !!!!!!!!!!!!!!!!!!!!!!!!!!!!!!!!!!!!!!!!!!!! @LABRCNTIP (procalcitonin:4,lacticidven:4) )No results found for this or any previous visit (from the past 240 hour(s)).   Radiological Exams on Admission: Dg Chest 2 View  Result Date: 11/16/2017 CLINICAL DATA:  Confusion EXAM: CHEST - 2 VIEW COMPARISON:  09/03/2012 FINDINGS: Small bilateral pleural effusions. Left greater than right bibasilar airspace disease. Normal heart size. No pneumothorax. IMPRESSION: Bilateral pleural effusions with bibasilar consolidations which may reflect atelectasis or pneumonia. Electronically Signed   By: Jasmine Pang M.D.   On: 11/16/2017 14:32   Ct Head Wo Contrast  Result Date: 11/16/2017 CLINICAL DATA:  Altered  mental status. EXAM: CT HEAD WITHOUT CONTRAST CT CERVICAL SPINE WITHOUT CONTRAST TECHNIQUE: Multidetector CT imaging of the head and cervical spine was performed following the standard protocol without intravenous contrast. Multiplanar CT image reconstructions of the cervical spine were also generated. COMPARISON:  CT scan of August 08, 2004. FINDINGS: CT HEAD FINDINGS Brain: Mild diffuse cortical atrophy is noted. Mild chronic ischemic white matter disease is noted. No mass effect or midline shift is noted. Ventricular size is within normal limits. There is no evidence of mass lesion, hemorrhage or acute infarction. Vascular: No hyperdense vessel or unexpected calcification. Skull: Normal. Negative for fracture or focal lesion. Sinuses/Orbits: No acute finding. Other: None. CT CERVICAL SPINE FINDINGS Alignment: Normal. Skull base and vertebrae: No acute fracture. No primary bone lesion or focal pathologic process. Soft tissues and spinal canal: No prevertebral fluid or swelling. No visible canal hematoma. Disc levels: There appears to be fusion of  C3-4. Moderate degenerative disc disease is noted at C5-6 and C6-7. Upper chest: Large bilateral pleural effusions are noted. Other: Degenerative changes seen involving posterior facet joints bilaterally. IMPRESSION: Mild diffuse cortical atrophy. Mild chronic ischemic white matter disease. No acute intracranial abnormality seen. Multilevel degenerative disc disease. No acute abnormality seen in the cervical spine. Large bilateral pleural effusions are seen within the visualized lung apices. Chest radiograph is recommended for further evaluation. Electronically Signed   By: Lupita Raider, M.D.   On: 11/16/2017 15:06   Ct Cervical Spine Wo Contrast  Result Date: 11/16/2017 CLINICAL DATA:  Altered mental status. EXAM: CT HEAD WITHOUT CONTRAST CT CERVICAL SPINE WITHOUT CONTRAST TECHNIQUE: Multidetector CT imaging of the head and cervical spine was performed following the standard protocol without intravenous contrast. Multiplanar CT image reconstructions of the cervical spine were also generated. COMPARISON:  CT scan of August 08, 2004. FINDINGS: CT HEAD FINDINGS Brain: Mild diffuse cortical atrophy is noted. Mild chronic ischemic white matter disease is noted. No mass effect or midline shift is noted. Ventricular size is within normal limits. There is no evidence of mass lesion, hemorrhage or acute infarction. Vascular: No hyperdense vessel or unexpected calcification. Skull: Normal. Negative for fracture or focal lesion. Sinuses/Orbits: No acute finding. Other: None. CT CERVICAL SPINE FINDINGS Alignment: Normal. Skull base and vertebrae: No acute fracture. No primary bone lesion or focal pathologic process. Soft tissues and spinal canal: No prevertebral fluid or swelling. No visible canal hematoma. Disc levels: There appears to be fusion of C3-4. Moderate degenerative disc disease is noted at C5-6 and C6-7. Upper chest: Large bilateral pleural effusions are noted. Other: Degenerative changes seen involving  posterior facet joints bilaterally. IMPRESSION: Mild diffuse cortical atrophy. Mild chronic ischemic white matter disease. No acute intracranial abnormality seen. Multilevel degenerative disc disease. No acute abnormality seen in the cervical spine. Large bilateral pleural effusions are seen within the visualized lung apices. Chest radiograph is recommended for further evaluation. Electronically Signed   By: Lupita Raider, M.D.   On: 11/16/2017 15:06    Old chart reviewed Case discussed with dr Jodi Mourning in the ED   Assessment/Plan 77 year old male with hepatic encephalopathy secondary to cirrhosis of the liver with also likely upper GI bleed Principal Problem:   Acute hepatic encephalopathy-placed on lactulose 3 times daily.  Repeat ammonia level in the morning.  Seems to be mild confusion only.  Frequent neurological checks.  Active Problems:   GIB (gastrointestinal bleeding)-transfuse 1 unit of blood tonight.  GI consulted and will see in the morning.  Placed on  octreotide drip.  Keep n.p.o. except ice chips and meds as patient will likely need a screening EGD for esophageal varices as a status is unknown.    Alcoholic cirrhosis (HCC)-as above it appears patient has been sober for about a month    Normocytic anemia due to blood loss-check anemia panel and transfusing 1 unit of blood tonight.  Pleural effusions-patient has no evidence of pneumonia or infection at active.  No cough no fevers.  Will monitor at this time.   DVT prophylaxis:  scds Code Status:  full Family Communication:  wife Disposition Plan:  2-4 days Consults called:  GI Admission status:  Admission    Delio Slates A MD [facility]  If 7PM-7AM, please contact night-coverage [email or web address] Password [email or web address]  11/16/2017, 3:51 PM

## 2017-11-16 NOTE — Telephone Encounter (Signed)
Patient's wife called office ,concerned about her husband. He has been weak this weekend and has experienced fall(s). She noted a little confusion early on Sunday morning,she is not sure if the patient took his Lactulose, this morning patient is very confused, along with being weak. Wife states, that his legs are real weak.  She has called her son to come and help.  Discussed with Dr.Rehman. It is felt that the patient should go to [facility] for evaluation.  Wife was called and informed,he had refused earlier.She thinks that she is going to call EMS.

## 2017-11-17 ENCOUNTER — Inpatient Hospital Stay (HOSPITAL_COMMUNITY): Payer: Medicare Other | Admitting: Anesthesiology

## 2017-11-17 ENCOUNTER — Inpatient Hospital Stay (HOSPITAL_COMMUNITY): Payer: Medicare Other

## 2017-11-17 ENCOUNTER — Encounter (HOSPITAL_COMMUNITY)
Admission: EM | Disposition: A | Discharge status: Home Health | Payer: Self-pay | Source: Home / Self Care | Attending: Family Medicine

## 2017-11-17 DIAGNOSIS — K449 Diaphragmatic hernia without obstruction or gangrene: Secondary | ICD-10-CM

## 2017-11-17 DIAGNOSIS — I8500 Esophageal varices without bleeding: Secondary | ICD-10-CM

## 2017-11-17 DIAGNOSIS — D500 Iron deficiency anemia secondary to blood loss (chronic): Secondary | ICD-10-CM

## 2017-11-17 DIAGNOSIS — K766 Portal hypertension: Secondary | ICD-10-CM

## 2017-11-17 DIAGNOSIS — K922 Gastrointestinal hemorrhage, unspecified: Secondary | ICD-10-CM

## 2017-11-17 DIAGNOSIS — K259 Gastric ulcer, unspecified as acute or chronic, without hemorrhage or perforation: Secondary | ICD-10-CM

## 2017-11-17 DIAGNOSIS — K3189 Other diseases of stomach and duodenum: Secondary | ICD-10-CM

## 2017-11-17 DIAGNOSIS — K7030 Alcoholic cirrhosis of liver without ascites: Secondary | ICD-10-CM

## 2017-11-17 HISTORY — PX: ESOPHAGOGASTRODUODENOSCOPY (EGD) WITH PROPOFOL: SHX5813

## 2017-11-17 HISTORY — PX: ESOPHAGEAL BANDING: SHX5518

## 2017-11-17 LAB — HEMOGLOBIN AND HEMATOCRIT, BLOOD
HCT: 24.4 % — ABNORMAL LOW (ref 39.0–52.0)
HCT: 27.5 % — ABNORMAL LOW (ref 39.0–52.0)
HEMATOCRIT: 31.4 % — AB (ref 39.0–52.0)
Hemoglobin: 7.8 g/dL — ABNORMAL LOW (ref 13.0–17.0)
Hemoglobin: 10.1 g/dL — ABNORMAL LOW (ref 13.0–17.0)
Hemoglobin: 8.9 g/dL — ABNORMAL LOW (ref 13.0–17.0)

## 2017-11-17 LAB — IRON AND TIBC
Iron: 60 ug/dL (ref 45–182)
SATURATION RATIOS: 16 % — AB (ref 17.9–39.5)
TIBC: 377 ug/dL (ref 250–450)
UIBC: 317 ug/dL

## 2017-11-17 LAB — CBC
HCT: 24.8 % — ABNORMAL LOW (ref 39.0–52.0)
HEMOGLOBIN: 7.7 g/dL — AB (ref 13.0–17.0)
MCH: 27.9 pg (ref 26.0–34.0)
MCHC: 31 g/dL (ref 30.0–36.0)
MCV: 89.9 fL (ref 78.0–100.0)
Platelets: 76 10*3/uL — ABNORMAL LOW (ref 150–400)
RBC: 2.76 MIL/uL — ABNORMAL LOW (ref 4.22–5.81)
RDW: 23.2 % — ABNORMAL HIGH (ref 11.5–15.5)
WBC: 4.6 10*3/uL (ref 4.0–10.5)

## 2017-11-17 LAB — COMPREHENSIVE METABOLIC PANEL
ALK PHOS: 46 U/L (ref 38–126)
ALT: 15 U/L (ref 0–44)
ANION GAP: 6 (ref 5–15)
AST: 35 U/L (ref 15–41)
Albumin: 2.2 g/dL — ABNORMAL LOW (ref 3.5–5.0)
BUN: 12 mg/dL (ref 8–23)
CALCIUM: 7.7 mg/dL — AB (ref 8.9–10.3)
CO2: 22 mmol/L (ref 22–32)
Chloride: 114 mmol/L — ABNORMAL HIGH (ref 98–111)
Creatinine, Ser: 0.8 mg/dL (ref 0.61–1.24)
GFR calc Af Amer: >60 mL/min (ref >60)
GFR calc non Af Amer: >60 mL/min (ref >60)
Glucose, Bld: 98 mg/dL (ref 70–99)
Potassium: 3.9 mmol/L (ref 3.5–5.1)
Sodium: 142 mmol/L (ref 135–145)
TOTAL PROTEIN: 5.2 g/dL — AB (ref 6.5–8.1)
Total Bilirubin: 2.6 mg/dL — ABNORMAL HIGH (ref 0.3–1.2)

## 2017-11-17 LAB — PREPARE RBC (CROSSMATCH)

## 2017-11-17 LAB — RETICULOCYTES
RBC.: 3.49 MIL/uL — AB (ref 4.22–5.81)
RETIC CT PCT: 3.4 % — AB (ref 0.4–3.1)
Retic Count, Absolute: 118.7 10*3/uL (ref 19.0–186.0)

## 2017-11-17 LAB — AMMONIA: AMMONIA: 48 umol/L — AB (ref 9–35)

## 2017-11-17 LAB — VITAMIN B12: Vitamin B-12: 2683 pg/mL — ABNORMAL HIGH (ref 180–914)

## 2017-11-17 LAB — FERRITIN: Ferritin: 25 ng/mL (ref 24–336)

## 2017-11-17 SURGERY — ESOPHAGOGASTRODUODENOSCOPY (EGD) WITH PROPOFOL
Anesthesia: Monitor Anesthesia Care

## 2017-11-17 MED ORDER — SODIUM CHLORIDE 0.9 % IV SOLN
1.0000 g | INTRAVENOUS | Status: DC
  Administered 2017-11-17 – 2017-11-21 (×5): 1 g via INTRAVENOUS
  Filled 2017-11-17: qty 10
  Filled 2017-11-17 (×5): qty 1
  Filled 2017-11-17: qty 10

## 2017-11-17 MED ORDER — PROMETHAZINE HCL 25 MG/ML IJ SOLN: 6.2500 mg | INTRAMUSCULAR | Status: DC | PRN

## 2017-11-17 MED ORDER — HYDROCODONE-ACETAMINOPHEN 7.5-325 MG PO TABS: 1.0000 | ORAL_TABLET | Freq: Once | ORAL | Status: DC | PRN

## 2017-11-17 MED ORDER — LACTATED RINGERS IV SOLN: INTRAVENOUS | Status: DC

## 2017-11-17 MED ORDER — SODIUM CHLORIDE 0.9% IV SOLUTION: Freq: Once | INTRAVENOUS | Status: DC

## 2017-11-17 MED ORDER — PROPOFOL 500 MG/50ML IV EMUL
INTRAVENOUS | Status: DC | PRN
  Administered 2017-11-17: 135 ug/kg/min via INTRAVENOUS

## 2017-11-17 MED ORDER — HYDROMORPHONE HCL 1 MG/ML IJ SOLN: 0.2500 mg | INTRAMUSCULAR | Status: DC | PRN

## 2017-11-17 MED ORDER — VITAMIN K1 10 MG/ML IJ SOLN
10.0000 mg | Freq: Once | INTRAMUSCULAR | Status: AC
  Administered 2017-11-17: 10 mg via SUBCUTANEOUS
  Filled 2017-11-17: qty 1

## 2017-11-17 MED ORDER — SODIUM CHLORIDE 0.9 % IV SOLN
INTRAVENOUS | Status: DC
  Administered 2017-11-17: 15:00:00 via INTRAVENOUS

## 2017-11-17 MED ORDER — LACTATED RINGERS IV SOLN
INTRAVENOUS | Status: DC | PRN
  Administered 2017-11-17: 16:00:00 via INTRAVENOUS

## 2017-11-17 MED ORDER — LORAZEPAM 2 MG/ML IJ SOLN
0.5000 mg | Freq: Once | INTRAMUSCULAR | Status: AC
  Administered 2017-11-17: 0.5 mg via INTRAVENOUS
  Filled 2017-11-17: qty 1

## 2017-11-17 MED ORDER — PANTOPRAZOLE SODIUM 40 MG IV SOLR
40.0000 mg | Freq: Once | INTRAVENOUS | Status: AC
  Administered 2017-11-17: 40 mg via INTRAVENOUS
  Filled 2017-11-17: qty 40

## 2017-11-17 MED ORDER — MEPERIDINE HCL 100 MG/ML IJ SOLN: 6.2500 mg | INTRAMUSCULAR | Status: DC | PRN

## 2017-11-17 MED ORDER — DIPHENHYDRAMINE HCL 25 MG PO CAPS
25.0000 mg | ORAL_CAPSULE | Freq: Once | ORAL | Status: AC
  Administered 2017-11-17: 25 mg via ORAL
  Filled 2017-11-17: qty 1

## 2017-11-17 MED ORDER — GADOBENATE DIMEGLUMINE 529 MG/ML IV SOLN
10.0000 mL | Freq: Once | INTRAVENOUS | Status: AC | PRN
  Administered 2017-11-17: 10 mL via INTRAVENOUS

## 2017-11-17 MED ORDER — SODIUM CHLORIDE 0.9 % IV SOLN
8.0000 mg/h | INTRAVENOUS | Status: AC
  Administered 2017-11-17 – 2017-11-19 (×4): 8 mg/h via INTRAVENOUS
  Filled 2017-11-17 (×7): qty 80

## 2017-11-17 MED ORDER — LACTATED RINGERS IV SOLN
INTRAVENOUS | Status: DC
  Administered 2017-11-17: 16:00:00 via INTRAVENOUS

## 2017-11-17 NOTE — Progress Notes (Addendum)
Patient with well known cirrhosis and hepatic encephalopathy well compensated for the past 2 years apparently missed 2 doses of lactulose on Monday and had some altered mental status sedation and confusion his ammonia level was 56 upon admission which is minimally elevated for him CT scan of the head reveals no evidence of acute infarct or bleed concerning presidents for an acute infarct will obtain MRI of the central nervous system.  He likewise has an anemia hemoglobin 7.7 and possible dark stools with GI bleed scheduled for EGD today being transfuse 1 unit of packed red blood cells at present he has hemoglobin hematocrit scheduled every 12 hours henceforth we will hold lactulose and any sedating medicines until EGD performed and hopefully MRI of CNS.  Anemia panel sent [name] ZOX:[id number] DOB: 1940-09-06 DOA: 11/16/2017 PCP: Oval Linsey, MD   Physical Exam: Blood pressure (!) 115/56, pulse 68, temperature 97.8 F (36.6 C), temperature source Oral, resp. rate 19, height 5\' 7"  (1.702 m), weight 67.4 kg (148 lb 9.4 oz), SpO2 98 %.  Lungs clear to A&P with a prolonged expiratory phase no rales wheeze or rhonchi appreciable heart regular rhythm no S3-S4 no heaves thrills rubs abdomen soft nontender bowel sounds normoactive cranial nerves II through XII grossly intact patient moves all 4 extremities patient understands the year the president and the place   Investigations:  Recent Results (from the past 240 hour(s))  MRSA PCR Screening     Status: None   Collection Time: 11/16/17  5:09 PM  Result Value Ref Range Status   MRSA by PCR NEGATIVE NEGATIVE Final    Comment:        The GeneXpert MRSA Assay (FDA approved for NASAL specimens only), is one component of a comprehensive MRSA colonization surveillance program. It is not intended to diagnose MRSA infection nor to guide or monitor treatment for MRSA infections. Performed at [facility], [address], [place],  [place] [address]      Basic Metabolic Panel: Recent Labs    11/16/17 1301 11/17/17 0413  NA 140 142  K 3.4* 3.9  CL 110 114*  CO2 21* 22  GLUCOSE 105* 98  BUN 13 12  CREATININE 0.87 0.80  CALCIUM 8.0* 7.7*  MG 1.9  --    Liver Function Tests: Recent Labs    11/16/17 1301 11/17/17 0413  AST 41 35  ALT 18 15  ALKPHOS 67 46  BILITOT 2.7* 2.6*  PROT 6.5 5.2*  ALBUMIN 2.7* 2.2*     CBC: Recent Labs    11/16/17 1301 11/17/17 0413 11/17/17 0808  WBC 10.6* 4.6  --   NEUTROABS 6.8  --   --   HGB 8.5* 7.7* 7.8*  HCT 27.3* 24.8* 24.4*  MCV 89.2 89.9  --   PLT 138* 76*  --     Dg Chest 2 View  Result Date: 11/16/2017 CLINICAL DATA:  Confusion EXAM: CHEST - 2 VIEW COMPARISON:  09/03/2012 FINDINGS: Small bilateral pleural effusions. Left greater than right bibasilar airspace disease. Normal heart size. No pneumothorax. IMPRESSION: Bilateral pleural effusions with bibasilar consolidations which may reflect atelectasis or pneumonia. Electronically Signed   By: Jasmine Pang M.D.   On: 11/16/2017 14:32   Ct Head Wo Contrast  Result Date: 11/16/2017 CLINICAL DATA:  Altered mental status. EXAM: CT HEAD WITHOUT CONTRAST CT CERVICAL SPINE WITHOUT CONTRAST TECHNIQUE: Multidetector CT imaging of the head and cervical spine was performed following the standard protocol without intravenous contrast. Multiplanar CT image reconstructions  of the cervical spine were also generated. COMPARISON:  CT scan of August 08, 2004. FINDINGS: CT HEAD FINDINGS Brain: Mild diffuse cortical atrophy is noted. Mild chronic ischemic white matter disease is noted. No mass effect or midline shift is noted. Ventricular size is within normal limits. There is no evidence of mass lesion, hemorrhage or acute infarction. Vascular: No hyperdense vessel or unexpected calcification. Skull: Normal. Negative for fracture or focal lesion. Sinuses/Orbits: No acute finding. Other: None. CT CERVICAL SPINE FINDINGS Alignment: Normal.  Skull base and vertebrae: No acute fracture. No primary bone lesion or focal pathologic process. Soft tissues and spinal canal: No prevertebral fluid or swelling. No visible canal hematoma. Disc levels: There appears to be fusion of C3-4. Moderate degenerative disc disease is noted at C5-6 and C6-7. Upper chest: Large bilateral pleural effusions are noted. Other: Degenerative changes seen involving posterior facet joints bilaterally. IMPRESSION: Mild diffuse cortical atrophy. Mild chronic ischemic white matter disease. No acute intracranial abnormality seen. Multilevel degenerative disc disease. No acute abnormality seen in the cervical spine. Large bilateral pleural effusions are seen within the visualized lung apices. Chest radiograph is recommended for further evaluation. Electronically Signed   By: Lupita RaiderJames  Green Jr, M.D.   On: 11/16/2017 15:06   Ct Cervical Spine Wo Contrast  Result Date: 11/16/2017 CLINICAL DATA:  Altered mental status. EXAM: CT HEAD WITHOUT CONTRAST CT CERVICAL SPINE WITHOUT CONTRAST TECHNIQUE: Multidetector CT imaging of the head and cervical spine was performed following the standard protocol without intravenous contrast. Multiplanar CT image reconstructions of the cervical spine were also generated. COMPARISON:  CT scan of August 08, 2004. FINDINGS: CT HEAD FINDINGS Brain: Mild diffuse cortical atrophy is noted. Mild chronic ischemic white matter disease is noted. No mass effect or midline shift is noted. Ventricular size is within normal limits. There is no evidence of mass lesion, hemorrhage or acute infarction. Vascular: No hyperdense vessel or unexpected calcification. Skull: Normal. Negative for fracture or focal lesion. Sinuses/Orbits: No acute finding. Other: None. CT CERVICAL SPINE FINDINGS Alignment: Normal. Skull base and vertebrae: No acute fracture. No primary bone lesion or focal pathologic process. Soft tissues and spinal canal: No prevertebral fluid or swelling. No visible  canal hematoma. Disc levels: There appears to be fusion of C3-4. Moderate degenerative disc disease is noted at C5-6 and C6-7. Upper chest: Large bilateral pleural effusions are noted. Other: Degenerative changes seen involving posterior facet joints bilaterally. IMPRESSION: Mild diffuse cortical atrophy. Mild chronic ischemic white matter disease. No acute intracranial abnormality seen. Multilevel degenerative disc disease. No acute abnormality seen in the cervical spine. Large bilateral pleural effusions are seen within the visualized lung apices. Chest radiograph is recommended for further evaluation. Electronically Signed   By: Lupita RaiderJames  Green Jr, M.D.   On: 11/16/2017 15:06      Medications:   Impression:  Principal Problem:   Acute hepatic encephalopathy Active Problems:   Alcoholic cirrhosis (HCC)   Normocytic anemia due to blood loss   GIB (gastrointestinal bleeding)     Plan: EGD for presumed upper GI bleed 1 hemoglobin hematocrit every 12 hours transfuse 1 unit of packed red blood cells now obtain MRI of his brain to rule out any infarct schedule ammonia level for a.m. consider resuming lactulose after EGD as per gastroenterology  Consultants: Gastroenterology   Procedures EGD planned   Antibiotics:           Time spent: 30 minutes   LOS: 1 day   Dorthey Depace M   11/17/2017, 1:10  PM

## 2017-11-17 NOTE — Anesthesia Preprocedure Evaluation (Signed)
Anesthesia Evaluation  Patient identified by MRN, date of birth, ID band Patient awake    Reviewed: Allergy & Precautions, H&P , NPO status , Patient's Chart, lab work & pertinent test results, reviewed documented beta blocker date and time   Airway Mallampati: II  TM Distance: >3 FB Neck ROM: full    Dental no notable dental hx. (+) Dental Advidsory Given   Pulmonary neg pulmonary ROS,    Pulmonary exam normal breath sounds clear to auscultation       Cardiovascular Exercise Tolerance: Good negative cardio ROS   Rhythm:regular Rate:Normal     Neuro/Psych  Neuromuscular disease negative psych ROS   GI/Hepatic Neg liver ROS, PUD,   Endo/Other  negative endocrine ROS  Renal/GU negative Renal ROS  negative genitourinary   Musculoskeletal   Abdominal   Peds  Hematology negative hematology ROS (+) anemia ,   Anesthesia Other Findings Hepatic encephalopathy with elevated ammonium level Hgb this am 7.8  Transfusion in progress  Repeat CBC pending Albuterol four times daily PLAN: start as MAC, convert to GAcETT if needed for airway protection  Reproductive/Obstetrics negative OB ROS                             Anesthesia Physical Anesthesia Plan  ASA: IV  Anesthesia Plan: MAC   Post-op Pain Management:    Induction:   PONV Risk Score and Plan:   Airway Management Planned:   Additional Equipment:   Intra-op Plan:   Post-operative Plan:   Informed Consent: I have reviewed the patients History and Physical, chart, labs and discussed the procedure including the risks, benefits and alternatives for the proposed anesthesia with the patient or authorized representative who has indicated his/her understanding and acceptance.   Dental Advisory Given  Plan Discussed with: CRNA  Anesthesia Plan Comments:         Anesthesia Quick Evaluation

## 2017-11-17 NOTE — Consult Note (Signed)
Referring Provider: Carlota Raspberry, MD Primary Care Physician:  Oval Linsey, MD Primary Gastroenterologist:  Dr. Karilyn Cota  Reason for Consultation:   Melena and anemia in a patient with known alcoholic cirrhosis.  HPI:   Patient is 77 year old Caucasian male who has history of alcoholic cirrhosis complicated by hepatic encephalopathy as well as ascites which has been well controlled with therapy.  He was last seen in the office on 08/04/2017 and it appeared to be stable.   Patient wife called office yesterday stating that he was confused. I recommended patient be taken to the emergency room for evaluation.  Patient was evaluated by Dr. Clarene Duke.  Serum ammonia was mildly elevated.  He had fallen a few days ago.  Head CT and CT of cervical spine was obtained in no acute abnormalities were noted.  He was noted to have diffuse cerebral atrophy.  Patient was noted to be anemic with hemoglobin of 8.5.  His stool was black and heme positive.  There was no history of nausea vomiting or abdominal pain.  According to his wife was at bedside he has had poor appetite for several weeks.  He has been gradually losing weight.  In the last 6 weeks he has drank beer on 3 different occasions.  She states he has been taking lactulose 3 times a day.  He is using pain medicine on as-needed basis and he did take a pill night before last.  He does not take any OTC medications. Lately he has not been doing much physical activity. He has not been taking aspirin or other OTC NSAIDs. Patient was admitted to ICU.  He was begun on octreotide infusion as well as pantoprazole.  He has received 1 unit of PRBCs and hemoglobin from this morning was 7.7 g. Review of the systems is negative for cough fever or chills.  He did pass blood in his urine several days ago. He does not smoke cigarettes. He is retired.  He was a Veterinary surgeon for 40 years. His son is at bedside.  He has a daughter who is a good health. Father died of  alcoholic liver disease in his 21H.   Past Medical History:  Diagnosis Date  . Anemia   .  Alcoholic cirrhosis (HCC)   . Elevated bilirubin   . Hepatic encephalopathy (HCC)   . Thrombocytopenia (HCC)     Past Surgical History:  Procedure Laterality Date  . neck fx     2005  . ORIF ANKLE FRACTURE Right 06/09/2012   Procedure: OPEN REDUCTION INTERNAL FIXATION (ORIF) ANKLE FRACTURE;  Surgeon: Vickki Hearing, MD;  Location: [facility];  Service: Orthopedics;  Laterality: Right;    Prior to Admission medications   Medication Sig Start Date End Date Taking? Authorizing Provider  albuterol (PROVENTIL HFA;VENTOLIN HFA) 108 (90 Base) MCG/ACT inhaler Inhale 2 puffs into the lungs 4 (four) times daily. 09/07/17  Yes [provider]  cholecalciferol (VITAMIN D) 1000 units tablet Take 1,000 Units by mouth daily.    Yes [provider]  CONSTULOSE 10 GM/15ML solution TAKE 15 ML BY MOUTH THREE TIMES DAILY 09/29/17  Yes Setzer, Terri L, NP  Multiple Vitamins-Minerals (MULTIVITAL) tablet Take 1 tablet by mouth daily. S.S.S Tonic   Yes [provider]  oxyCODONE (ROXICODONE) 5 MG immediate release tablet Take 0.5 tablets (2.5 mg total) by mouth every 4 (four) hours as needed for pain. Patient taking differently: Take 5 mg by mouth 2 (two) times daily.  09/28/12  Yes Setzer, Brand Males,  NP    Current Facility-Administered Medications  Medication Dose Route Frequency Provider Last Rate Last Dose  . 0.9 %  sodium chloride infusion (Manually program via Guardrails IV Fluids)   Intravenous Once Lennard Capek U, MD      . 0.9 %  sodium chloride infusion  250 mL Intravenous PRN Haydee Monica, MD 10 mL/hr at 11/17/17 0200    . albuterol (PROVENTIL) (2.5 MG/3ML) 0.083% nebulizer solution 2.5 mg  2.5 mg Nebulization Q4H PRN Haydee Monica, MD      . cefTRIAXone (ROCEPHIN) 1 g in sodium chloride 0.9 % 100 mL IVPB  1 g Intravenous Q24H Madai Nuccio U, MD      . diphenhydrAMINE  (BENADRYL) capsule 25 mg  25 mg Oral Once Conny Situ U, MD      . folic acid (FOLVITE) tablet 1 mg  1 mg Oral Daily Tarry Kos A, MD   1 mg at 11/16/17 1745  . lactulose (CHRONULAC) 10 GM/15ML solution 20 g  20 g Oral TID Haydee Monica, MD   20 g at 11/16/17 2300  . MEDLINE mouth rinse  15 mL Mouth Rinse BID Oval Linsey, MD      . multivitamin with minerals tablet 1 tablet  1 tablet Oral Daily Haydee Monica, MD   1 tablet at 11/16/17 1745  . octreotide (SANDOSTATIN) 500 mcg in sodium chloride 0.9 % 250 mL (2 mcg/mL) infusion  25 mcg/hr Intravenous Continuous Tarry Kos A, MD 12.5 mL/hr at 11/17/17 0200 25 mcg/hr at 11/17/17 0200  . ondansetron (ZOFRAN) tablet 4 mg  4 mg Oral Q6H PRN Haydee Monica, MD       Or  . ondansetron (ZOFRAN) injection 4 mg  4 mg Intravenous Q6H PRN Haydee Monica, MD      . phytonadione (VITAMIN K) SQ injection 10 mg  10 mg Subcutaneous Once Laylonie Marzec U, MD      . sodium chloride flush (NS) 0.9 % injection 3 mL  3 mL Intravenous Q12H David, Rachal A, MD      . sodium chloride flush (NS) 0.9 % injection 3 mL  3 mL Intravenous PRN Tarry Kos A, MD      . thiamine (VITAMIN B-1) tablet 100 mg  100 mg Oral Daily Tarry Kos A, MD   100 mg at 11/16/17 1745    Allergies as of 11/16/2017  . (No Known Allergies)    No family history on file.  Social History   Socioeconomic History  . Marital status: Married    Spouse name: Not on file  . Number of children: Not on file  . Years of education: Not on file  . Highest education level: Not on file  Occupational History  . Not on file  Social Needs  . Financial resource strain: Not on file  . Food insecurity:    Worry: Not on file    Inability: Not on file  . Transportation needs:    Medical: Not on file    Non-medical: Not on file  Tobacco Use  . Smoking status: Never Smoker  . Smokeless tobacco: Never Used  Substance and Sexual Activity  . Alcohol use: No    Alcohol/week: 0.0 oz     Comment: NO etoh since February, 2014  . Drug use: No  . Sexual activity: Yes    Birth control/protection: None  Lifestyle  . Physical activity:    Days per week: Not on file    Minutes  per session: Not on file  . Stress: Not on file  Relationships  . Social connections:    Talks on phone: Not on file    Gets together: Not on file    Attends religious service: Not on file    Active member of club or organization: Not on file    Attends meetings of clubs or organizations: Not on file    Relationship status: Not on file  . Intimate partner violence:    Fear of current or ex partner: Not on file    Emotionally abused: Not on file    Physically abused: Not on file    Forced sexual activity: Not on file  Other Topics Concern  . Not on file  Social History Narrative  . Not on file    Review of Systems: See HPI, otherwise normal ROS  Physical Exam: Temp:  [97.6 F (36.4 C)-98.7 F (37.1 C)] 98.7 F (37.1 C) (07/30 0724) Pulse Rate:  [73-114] 74 (07/30 0724) Resp:  [12-23] 17 (07/30 0724) BP: (107-153)/(52-96) 107/96 (07/30 0200) SpO2:  [92 %-100 %] 98 % (07/30 0724) Weight:  [148 lb 9.4 oz (67.4 kg)-149 lb (67.6 kg)] 148 lb 9.4 oz (67.4 kg) (07/29 1753)   Well-developed Caucasian male with generalized muscle wasting. Patient is alert but he is not oriented to place or time. He has tremors but no asterixis. Conjunctiva is pale.  Sclerae nonicteric. Oropharyngeal mucosa is dry. No neck masses or thyromegaly noted. Cardiac exam with regular rhythm normal S1 and S2.  No murmur or gallop noted. Auscultation lungs reveal vesicular breath sounds bilaterally. Abdomen is full.  Small umbilical hernia noted.  Bowel sounds are normal.  On palpation abdomen is soft.  Spleen is easily palpable.  Liver edge is indistinct. No peripheral edema or clubbing noted.  Lab Results: Recent Labs    11/16/17 1301 11/17/17 0413  WBC 10.6* 4.6  HGB 8.5* 7.7*  HCT 27.3* 24.8*  PLT 138*  76*   BMET Recent Labs    11/16/17 1301 11/17/17 0413  NA 140 142  K 3.4* 3.9  CL 110 114*  CO2 21* 22  GLUCOSE 105* 98  BUN 13 12  CREATININE 0.87 0.80  CALCIUM 8.0* 7.7*   LFT Recent Labs    11/17/17 0413  PROT 5.2*  ALBUMIN 2.2*  AST 35  ALT 15  ALKPHOS 46  BILITOT 2.6*   PT/INR Recent Labs    11/16/17 1301  LABPROT 24.2*  INR 2.20   Hepatitis Panel No results for input(s): HEPBSAG, HCVAB, HEPAIGM, HEPBIGM in the last 72 hours.  Studies/Results: Dg Chest 2 View  Result Date: 11/16/2017 CLINICAL DATA:  Confusion EXAM: CHEST - 2 VIEW COMPARISON:  09/03/2012 FINDINGS: Small bilateral pleural effusions. Left greater than right bibasilar airspace disease. Normal heart size. No pneumothorax. IMPRESSION: Bilateral pleural effusions with bibasilar consolidations which may reflect atelectasis or pneumonia. Electronically Signed   By: Jasmine Pang M.D.   On: 11/16/2017 14:32   Ct Head Wo Contrast  Result Date: 11/16/2017 CLINICAL DATA:  Altered mental status. EXAM: CT HEAD WITHOUT CONTRAST CT CERVICAL SPINE WITHOUT CONTRAST TECHNIQUE: Multidetector CT imaging of the head and cervical spine was performed following the standard protocol without intravenous contrast. Multiplanar CT image reconstructions of the cervical spine were also generated. COMPARISON:  CT scan of August 08, 2004. FINDINGS: CT HEAD FINDINGS Brain: Mild diffuse cortical atrophy is noted. Mild chronic ischemic white matter disease is noted. No mass effect or midline shift is  noted. Ventricular size is within normal limits. There is no evidence of mass lesion, hemorrhage or acute infarction. Vascular: No hyperdense vessel or unexpected calcification. Skull: Normal. Negative for fracture or focal lesion. Sinuses/Orbits: No acute finding. Other: None. CT CERVICAL SPINE FINDINGS Alignment: Normal. Skull base and vertebrae: No acute fracture. No primary bone lesion or focal pathologic process. Soft tissues and spinal  canal: No prevertebral fluid or swelling. No visible canal hematoma. Disc levels: There appears to be fusion of C3-4. Moderate degenerative disc disease is noted at C5-6 and C6-7. Upper chest: Large bilateral pleural effusions are noted. Other: Degenerative changes seen involving posterior facet joints bilaterally. IMPRESSION: Mild diffuse cortical atrophy. Mild chronic ischemic white matter disease. No acute intracranial abnormality seen. Multilevel degenerative disc disease. No acute abnormality seen in the cervical spine. Large bilateral pleural effusions are seen within the visualized lung apices. Chest radiograph is recommended for further evaluation. Electronically Signed   By: Lupita Raider, M.D.   On: 11/16/2017 15:06   Ct Cervical Spine Wo Contrast  Result Date: 11/16/2017 CLINICAL DATA:  Altered mental status. EXAM: CT HEAD WITHOUT CONTRAST CT CERVICAL SPINE WITHOUT CONTRAST TECHNIQUE: Multidetector CT imaging of the head and cervical spine was performed following the standard protocol without intravenous contrast. Multiplanar CT image reconstructions of the cervical spine were also generated. COMPARISON:  CT scan of August 08, 2004. FINDINGS: CT HEAD FINDINGS Brain: Mild diffuse cortical atrophy is noted. Mild chronic ischemic white matter disease is noted. No mass effect or midline shift is noted. Ventricular size is within normal limits. There is no evidence of mass lesion, hemorrhage or acute infarction. Vascular: No hyperdense vessel or unexpected calcification. Skull: Normal. Negative for fracture or focal lesion. Sinuses/Orbits: No acute finding. Other: None. CT CERVICAL SPINE FINDINGS Alignment: Normal. Skull base and vertebrae: No acute fracture. No primary bone lesion or focal pathologic process. Soft tissues and spinal canal: No prevertebral fluid or swelling. No visible canal hematoma. Disc levels: There appears to be fusion of C3-4. Moderate degenerative disc disease is noted at C5-6 and  C6-7. Upper chest: Large bilateral pleural effusions are noted. Other: Degenerative changes seen involving posterior facet joints bilaterally. IMPRESSION: Mild diffuse cortical atrophy. Mild chronic ischemic white matter disease. No acute intracranial abnormality seen. Multilevel degenerative disc disease. No acute abnormality seen in the cervical spine. Large bilateral pleural effusions are seen within the visualized lung apices. Chest radiograph is recommended for further evaluation. Electronically Signed   By: Lupita Raider, M.D.   On: 11/16/2017 15:06    Assessment;  Patient is 77 year old Caucasian male who has alcoholic cirrhosis complicated by hepatic encephalopathy and ascites has never been screened for esophageal varices because he has declined.  He presents with hepatic encephalopathy and noted to have melena and is anemic.  No history of fever. Suspect hepatic encephalopathy triggered by GI bleed.  Differential diagnosis includes variceal versus non-variceal bleed.  His hemoglobin is still low and he would benefit from another unit of PRBCs.  He will undergo esophagogastroduodenoscopy later today.  If he has large varices these will be banded. Given history of liver disease and ascites he is needs to be covered with an antibiotic and its incidence of occult infection is high.  Anorexia and weight loss.  He is up-to-date on screening for HCC.  Urinalysis positive for red cells.  Further work-up per Dr. Janna Arch.  Recommendations;  Ceftriaxone 1 g IV every 24 hours. Vitamin K 10 mg subcu x1. Will transfuse 1  unit of PRBCs. Diagnostic and therapeutic esophagogastroduodenoscopy under monitored anesthesia care later today.   LOS: 1 day   Shannin Naab  11/17/2017, 8:09 AM

## 2017-11-17 NOTE — Transfer of Care (Signed)
Immediate Anesthesia Transfer of Care Note  Patient: Bradley Beltran.  Procedure(s) Performed: ESOPHAGOGASTRODUODENOSCOPY (EGD) WITH PROPOFOL (N/A ) ESOPHAGEAL BANDING  Patient Location: PACU  Anesthesia Type:MAC  Level of Consciousness: drowsy  Airway & Oxygen Therapy: Patient Spontanous Breathing and Patient connected to face mask oxygen  Post-op Assessment: Report given to RN and Patient moving all extremities  Post vital signs: Reviewed and stable  Last Vitals:  Vitals Value Taken Time  BP    Temp    Pulse    Resp    SpO2      Last Pain:  Vitals:   11/17/17 1606  TempSrc:   PainSc: 0-No pain         Complications: No apparent anesthesia complications

## 2017-11-17 NOTE — Anesthesia Postprocedure Evaluation (Signed)
Anesthesia Post Note  Patient: Bradley Beltran.  Procedure(s) Performed: ESOPHAGOGASTRODUODENOSCOPY (EGD) WITH PROPOFOL (N/A ) ESOPHAGEAL BANDING  Patient location during evaluation: PACU Anesthesia Type: MAC Level of consciousness: awake and patient cooperative Pain management: pain level controlled Vital Signs Assessment: post-procedure vital signs reviewed and stable Respiratory status: spontaneous breathing, nonlabored ventilation, respiratory function stable and patient connected to nasal cannula oxygen Cardiovascular status: blood pressure returned to baseline Postop Assessment: no apparent nausea or vomiting Anesthetic complications: no     Last Vitals:  Vitals:   11/17/17 1437 11/17/17 1640  BP: 123/67 (P) 129/73  Pulse: 62 (P) 62  Resp: 18 (P) 20  Temp: 36.4 C (P) 36.8 C  SpO2: 98% (P) 100%    Last Pain:  Vitals:   11/17/17 1640  TempSrc:   PainSc: (P) Asleep                 Bre Pecina J

## 2017-11-17 NOTE — Progress Notes (Signed)
Brief EGD note  3 columns of esophageal varices; 2 columns grade III. Small sliding hiatal hernia. Portal gastropathy and duodenopathy. Antral erosion. Patient bled from 1 of the varices before banding. This varix was banded at 2 levels and other varix was banded at one level.

## 2017-11-17 NOTE — Op Note (Signed)
[facility] Patient Name: Bradley Beltran Procedure Date: 11/17/2017 4:02 PM MRN: [id number] Date of Birth: 1940-10-02 Attending MD: Lionel December , MD CSN: [id number] Age: 77 Admit Type: Inpatient Procedure:                Upper GI endoscopy Indications:              Melena, Suspected upper gastrointestinal bleeding Providers:                Lionel December, MD, Loma Messing B. Patsy Lager, RN, Ina Homes, Technician Referring MD:             Duke Salvia. Dondiego, MD Medicines:                Propofol per Anesthesia Complications:            No immediate complications. Estimated Blood Loss:     Estimated blood loss: approximately 60 mL. Procedure:                Pre-Anesthesia Assessment:                           - Prior to the procedure, a History and Physical                            was performed, and patient medications and                            allergies were reviewed. The patient's tolerance of                            previous anesthesia was also reviewed. The risks                            and benefits of the procedure and the sedation                            options and risks were discussed with the patient.                            All questions were answered, and informed consent                            was obtained. Prior Anticoagulants: The patient has                            taken no previous anticoagulant or antiplatelet                            agents. ASA Grade Assessment: IV - A patient with                            severe systemic disease that is a constant threat  to life. After reviewing the risks and benefits,                            the patient was deemed in satisfactory condition to                            undergo the procedure.                           After obtaining informed consent, the endoscope was                            passed under direct vision. Throughout the                       procedure, the patient's blood pressure, pulse, and                            oxygen saturations were monitored continuously. The                            [id number]-[id number] ([id number]) scope was introduced through the                            mouth, and advanced to the second part of duodenum.                            The upper GI endoscopy was accomplished without                            difficulty. The patient tolerated the procedure                            well. Scope In: 4:14:49 PM Scope Out: 4:30:06 PM Total Procedure Duration: 0 hours 15 minutes 17 seconds  Findings:      The proximal esophagus and mid esophagus were normal.      Grade II, grade III varices were found in the distal esophagus. Three       bands were successfully placed with incomplete eradication of varices.       Spontaneous bleeding noted from varix on the left side and controlled       with application of two bands..      The Z-line was irregular and was found 38 cm from the incisors.      A 2 cm hiatal hernia was present.      Moderate portal hypertensive gastropathy was found in the entire       examined stomach.      Mucosal changes characterized by erosion were found in the prepyloric       region of the stomach.      Diffuse moderate mucosal changes characterized by congestion were found       in the duodenal bulb and in the second portion of the duodenum. Impression:               - Normal proximal esophagus and mid esophagus.                           -  Grade II and grade III esophageal varices.                            Incompletely eradicated. Banded.                           - Z-line irregular, 38 cm from the incisors.                           - 2 cm hiatal hernia.                           - Portal hypertensive gastropathy.                           - Eroded mucosa in the prepyloric region of the                            stomach.                           - Mucosal changes  in the duodenum.                           - No specimens collected. Moderate Sedation:      Per Anesthesia Care Recommendation:           - Return patient to ICU for ongoing care.                           - NPO except ice chips.                           - Continue present medications.                           - H/H at 8 pm.                           H. Pylori serology.                           - Repeat upper endoscopy in 6 weeks. Procedure Code(s):        --- Professional ---                           [phone], Esophagogastroduodenoscopy, flexible,                            transoral; with band ligation of esophageal/gastric                            varices Diagnosis Code(s):        --- Professional ---                           I85.00, Esophageal varices without bleeding  K22.8, Other specified diseases of esophagus                           K44.9, Diaphragmatic hernia without obstruction or                            gangrene                           K76.6, Portal hypertension                           K31.89, Other diseases of stomach and duodenum                           K25.9, Gastric ulcer, unspecified as acute or                            chronic, without hemorrhage or perforation                           K92.1, Melena (includes Hematochezia) CPT copyright 2017 [employer]. All rights reserved. The codes documented in this report are preliminary and upon coder review may  be revised to meet current compliance requirements. Lionel DecemberNajeeb Erez Mccallum, MD Lionel DecemberNajeeb Raylea Adcox, MD 11/17/2017 5:20:39 PM This report has been signed electronically. Number of Addenda: 0

## 2017-11-18 ENCOUNTER — Encounter (HOSPITAL_COMMUNITY): Payer: Self-pay | Admitting: Internal Medicine

## 2017-11-18 DIAGNOSIS — Z7189 Other specified counseling: Secondary | ICD-10-CM

## 2017-11-18 DIAGNOSIS — E43 Unspecified severe protein-calorie malnutrition: Secondary | ICD-10-CM

## 2017-11-18 DIAGNOSIS — Z515 Encounter for palliative care: Secondary | ICD-10-CM

## 2017-11-18 LAB — AMMONIA: AMMONIA: 34 umol/L (ref 9–35)

## 2017-11-18 LAB — URINE CULTURE: CULTURE: NO GROWTH

## 2017-11-18 LAB — BASIC METABOLIC PANEL
ANION GAP: 8 (ref 5–15)
BUN: 10 mg/dL (ref 8–23)
CALCIUM: 7.7 mg/dL — AB (ref 8.9–10.3)
CO2: 21 mmol/L — ABNORMAL LOW (ref 22–32)
Chloride: 113 mmol/L — ABNORMAL HIGH (ref 98–111)
Creatinine, Ser: 0.78 mg/dL (ref 0.61–1.24)
Glucose, Bld: 85 mg/dL (ref 70–99)
Potassium: 3.5 mmol/L (ref 3.5–5.1)
SODIUM: 142 mmol/L (ref 135–145)

## 2017-11-18 LAB — PROTIME-INR
INR: 2.31
PROTHROMBIN TIME: 25.2 s — AB (ref 11.4–15.2)

## 2017-11-18 LAB — BPAM RBC
Blood Product Expiration Date: 201908202359
Blood Product Expiration Date: 201908202359
ISSUE DATE / TIME: 201907292044
ISSUE DATE / TIME: 201907301106
Unit Type and Rh: 6200
Unit Type and Rh: 6200

## 2017-11-18 LAB — HEMOGLOBIN AND HEMATOCRIT, BLOOD
HCT: 30 % — ABNORMAL LOW (ref 39.0–52.0)
HCT: 30 % — ABNORMAL LOW (ref 39.0–52.0)
HEMATOCRIT: 28.1 % — AB (ref 39.0–52.0)
Hemoglobin: 9.5 g/dL — ABNORMAL LOW (ref 13.0–17.0)
Hemoglobin: 9.5 g/dL — ABNORMAL LOW (ref 13.0–17.0)
Hemoglobin: 9.1 g/dL — ABNORMAL LOW (ref 13.0–17.0)

## 2017-11-18 LAB — FOLATE: Folate: 72 ng/mL (ref >5.9)

## 2017-11-18 LAB — TYPE AND SCREEN
ABO/RH(D): A POS
Antibody Screen: NEGATIVE
Unit division: 0
Unit division: 0

## 2017-11-18 MED ORDER — PRO-STAT SUGAR FREE PO LIQD
30.0000 mL | Freq: Three times a day (TID) | ORAL | Status: DC
  Administered 2017-11-18 – 2017-11-21 (×9): 30 mL via ORAL
  Filled 2017-11-18 (×8): qty 30

## 2017-11-18 MED ORDER — SODIUM CHLORIDE 0.9 % IV SOLN: 250.0000 mL | INTRAVENOUS | Status: DC | PRN

## 2017-11-18 NOTE — Progress Notes (Signed)
Patient had EGD appreciate notes of gastroenterology.  Hemoglobin hematocrit checked every every 12 hours currently 9.5 stable patient tolerating a liquid diet alert and oriented improved mental status from yesterday receiving lactulose 20 twice daily will elect to get patient out of bed to chair today he is likewise been made a DNR and after discussion with patient and family Bradley Beltran. ZOX:[id number] DOB: March 08, 1941 DOA: 11/16/2017 PCP: Oval Linsey, MD   Physical Exam: Blood pressure 128/67, pulse (!) 59, temperature 97.8 F (36.6 C), temperature source Oral, resp. rate (!) 23, height 5\' 7"  (1.702 m), weight 63.4 kg (139 lb 12.4 oz), SpO2 95 %.  Lungs clear to A&P no rales wheeze or rhonchi heart regular rhythm no S3-S4 no heaves thrills rubs abdomen soft nontender bowel sounds normoactive no guarding or rebound no masses no megaly   Investigations:  Recent Results (from the past 240 hour(s))  Urine culture     Status: None   Collection Time: 11/16/17  2:05 PM  Result Value Ref Range Status   Specimen Description   Final    URINE, CATHETERIZED Performed at [facility], [address] [place], [place] [address]    Special Requests   Final    NONE Performed at [facility], [address] [place], [place] [address]    Culture   Final    NO GROWTH Performed at [facility], [address], [place], [place] [address]    Report Status 11/18/2017 FINAL  Final  MRSA PCR Screening     Status: None   Collection Time: 11/16/17  5:09 PM  Result Value Ref Range Status   MRSA by PCR NEGATIVE NEGATIVE Final    Comment:        The GeneXpert MRSA Assay (FDA approved for NASAL specimens only), is one component of a comprehensive MRSA colonization surveillance program. It is not intended to diagnose MRSA infection nor to guide or monitor treatment for MRSA infections. Performed at [facility], [address] [place], [place] [address]      Basic Metabolic  Panel: Recent Labs    11/16/17 1301 11/17/17 0413 11/18/17 0211  NA 140 142 142  K 3.4* 3.9 3.5  CL 110 114* 113*  CO2 21* 22 21*  GLUCOSE 105* 98 85  BUN 13 12 10   CREATININE 0.87 0.80 0.78  CALCIUM 8.0* 7.7* 7.7*  MG 1.9  --   --    Liver Function Tests: Recent Labs    11/16/17 1301 11/17/17 0413  AST 41 35  ALT 18 15  ALKPHOS 67 46  BILITOT 2.7* 2.6*  PROT 6.5 5.2*  ALBUMIN 2.7* 2.2*     CBC: Recent Labs    11/16/17 1301 11/17/17 0413  11/17/17 2010 11/18/17 0211  WBC 10.6* 4.6  --   --   --   NEUTROABS 6.8  --   --   --   --   HGB 8.5* 7.7*   < > 8.9* 9.5*  HCT 27.3* 24.8*   < > 27.5* 30.0*  MCV 89.2 89.9  --   --   --   PLT 138* 76*  --   --   --    < > = values in this interval not displayed.    Dg Chest 2 View  Result Date: 11/16/2017 CLINICAL DATA:  Confusion EXAM: CHEST - 2 VIEW COMPARISON:  09/03/2012 FINDINGS: Small bilateral pleural effusions. Left greater than right bibasilar airspace disease. Normal heart size. No pneumothorax.  IMPRESSION: Bilateral pleural effusions with bibasilar consolidations which may reflect atelectasis or pneumonia. Electronically Signed   By: Jasmine Pang M.D.   On: 11/16/2017 14:32   Ct Head Wo Contrast  Result Date: 11/16/2017 CLINICAL DATA:  Altered mental status. EXAM: CT HEAD WITHOUT CONTRAST CT CERVICAL SPINE WITHOUT CONTRAST TECHNIQUE: Multidetector CT imaging of the head and cervical spine was performed following the standard protocol without intravenous contrast. Multiplanar CT image reconstructions of the cervical spine were also generated. COMPARISON:  CT scan of August 08, 2004. FINDINGS: CT HEAD FINDINGS Brain: Mild diffuse cortical atrophy is noted. Mild chronic ischemic white matter disease is noted. No mass effect or midline shift is noted. Ventricular size is within normal limits. There is no evidence of mass lesion, hemorrhage or acute infarction. Vascular: No hyperdense vessel or unexpected calcification.  Skull: Normal. Negative for fracture or focal lesion. Sinuses/Orbits: No acute finding. Other: None. CT CERVICAL SPINE FINDINGS Alignment: Normal. Skull base and vertebrae: No acute fracture. No primary bone lesion or focal pathologic process. Soft tissues and spinal canal: No prevertebral fluid or swelling. No visible canal hematoma. Disc levels: There appears to be fusion of C3-4. Moderate degenerative disc disease is noted at C5-6 and C6-7. Upper chest: Large bilateral pleural effusions are noted. Other: Degenerative changes seen involving posterior facet joints bilaterally. IMPRESSION: Mild diffuse cortical atrophy. Mild chronic ischemic white matter disease. No acute intracranial abnormality seen. Multilevel degenerative disc disease. No acute abnormality seen in the cervical spine. Large bilateral pleural effusions are seen within the visualized lung apices. Chest radiograph is recommended for further evaluation. Electronically Signed   By: Lupita Raider, M.D.   On: 11/16/2017 15:06   Ct Cervical Spine Wo Contrast  Result Date: 11/16/2017 CLINICAL DATA:  Altered mental status. EXAM: CT HEAD WITHOUT CONTRAST CT CERVICAL SPINE WITHOUT CONTRAST TECHNIQUE: Multidetector CT imaging of the head and cervical spine was performed following the standard protocol without intravenous contrast. Multiplanar CT image reconstructions of the cervical spine were also generated. COMPARISON:  CT scan of August 08, 2004. FINDINGS: CT HEAD FINDINGS Brain: Mild diffuse cortical atrophy is noted. Mild chronic ischemic white matter disease is noted. No mass effect or midline shift is noted. Ventricular size is within normal limits. There is no evidence of mass lesion, hemorrhage or acute infarction. Vascular: No hyperdense vessel or unexpected calcification. Skull: Normal. Negative for fracture or focal lesion. Sinuses/Orbits: No acute finding. Other: None. CT CERVICAL SPINE FINDINGS Alignment: Normal. Skull base and vertebrae:  No acute fracture. No primary bone lesion or focal pathologic process. Soft tissues and spinal canal: No prevertebral fluid or swelling. No visible canal hematoma. Disc levels: There appears to be fusion of C3-4. Moderate degenerative disc disease is noted at C5-6 and C6-7. Upper chest: Large bilateral pleural effusions are noted. Other: Degenerative changes seen involving posterior facet joints bilaterally. IMPRESSION: Mild diffuse cortical atrophy. Mild chronic ischemic white matter disease. No acute intracranial abnormality seen. Multilevel degenerative disc disease. No acute abnormality seen in the cervical spine. Large bilateral pleural effusions are seen within the visualized lung apices. Chest radiograph is recommended for further evaluation. Electronically Signed   By: Lupita Raider, M.D.   On: 11/16/2017 15:06   Mr Laqueta Jean WU Contrast  Result Date: 11/17/2017 CLINICAL DATA:  Altered mental status. EXAM: MRI HEAD WITHOUT AND WITH CONTRAST TECHNIQUE: Multiplanar, multiecho pulse sequences of the brain and surrounding structures were obtained without and with intravenous contrast. CONTRAST:  10mL MULTIHANCE GADOBENATE DIMEGLUMINE  529 MG/ML IV SOLN COMPARISON:  Head CT 11/16/2017 Brain MRI 02/26/2004 FINDINGS: The study is degraded by motion, despite efforts to reduce this artifact, including utilization of motion-resistant MR sequences. The findings of the study are interpreted in the context of reduced sensitivity/specificity. BRAIN: There is no acute infarct, acute hemorrhage or mass effect. Early confluent hyperintense T2-weighted signal of the periventricular and deep white matter, most commonly due to chronic ischemic microangiopathy. Generalized atrophy without lobar predilection. VASCULAR: Major intracranial arterial and venous sinus flow voids are preserved. SKULL AND UPPER CERVICAL SPINE: The visualized skull base, calvarium, upper cervical spine and extracranial soft tissues are normal.  SINUSES/ORBITS: No fluid levels or advanced mucosal thickening. No mastoid or middle ear effusion. The orbits are normal. IMPRESSION: 1. Severely motion degraded examination. 2. Within that limitation, no acute intracranial abnormality. 3. Chronic ischemic microangiopathy and generalized atrophy. Electronically Signed   By: Deatra RobinsonKevin  Herman M.D.   On: 11/17/2017 14:33      Medications:   Impression:  Principal Problem:   Acute hepatic encephalopathy Active Problems:   Alcoholic cirrhosis (HCC)   Normocytic anemia due to blood loss   GIB (gastrointestinal bleeding)   Protein-calorie malnutrition, severe     Plan: Out of bed to chair 3 times daily, patient is a DNR officially in chart and record, monitor hemoglobin hematocrit every 12 hours  Consultants: Gastroenterology   Procedures   Antibiotics:           Time spent: 30 minutes   LOS: 2 days   Prima Rayner M   11/18/2017, 11:58 AM

## 2017-11-18 NOTE — Progress Notes (Signed)
This RN paged Dr.Blount to ask for a flexiseal d/t watery stools. Pt is receiving lactulose plus GIB- skin is getting irritated.  waiting for orders/call back. Will continue to monitor pt

## 2017-11-18 NOTE — Progress Notes (Signed)
Dr. Janna Archondiego in to see pt. Family present at bedside. Wanted to discuss DNR status with him. Dr. Janna Archondiego said " I thought he was a DNR. Mr. Launa GrillKnighten do you want us shocking you like they do on tv?." Pt stated "No need." DNR orders made and hard copy signed and placed on chart. Family and nurse concerned about pt's bloody stool. Dr. Janna Archondiego assured family that "blood is blood and we're in healthcare so we see it." Also pointed out that pt was looking better and that Hgb was 9mmHg this am.  Also made Dr. Karilyn Cotaehman aware. Awaiting results of H&H that Dr. Janna Archondiego has ordered every 12 hours. Also increased Sandostatin to 550mcg/hr per Dr. Karilyn Cotaehman. Dondiego wrote new orders for pt up to bedside chair. Pt very weak and still actively bleeding large amounts per rectum when up to bedside commode. Not safe at this time to transfer to chair and will continue to monitor. VSS. Family still voiced concerns over what DNR meant. Attempted to clarify and stated a better understanding. Family and pt also agreed that pt too weak to attempt transfer at this time.

## 2017-11-18 NOTE — Progress Notes (Signed)
  Subjective:  Patient has no complaints.  He wants to go home.  He denies nausea or abdominal pain.  He had large maroonish stool earlier today and a swallow on later.  No history of hematemesis.  Objective: Blood pressure 130/79, pulse 67, temperature 98.1 F (36.7 C), temperature source Oral, resp. rate (!) 22, height 5\' 7"  (1.702 m), weight 139 lb 12.4 oz (63.4 kg), SpO2 95 %. Patient is alert.  [facility] [place] [facility] part of colon.  He is oriented to the year and thinks it is August. Abdomen is full but soft and nontender with palpable spleen. No LE edema or clubbing noted.  Labs/studies Results:  Recent Labs    11/16/17 1301 11/17/17 0413  11/17/17 2010 11/18/17 0211 11/18/17 1224  WBC 10.6* 4.6  --   --   --   --   HGB 8.5* 7.7*   < > 8.9* 9.5* 9.5*  HCT 27.3* 24.8*   < > 27.5* 30.0* 30.0*  PLT 138* 76*  --   --   --   --    < > = values in this interval not displayed.    BMET  Recent Labs    11/16/17 1301 11/17/17 0413 11/18/17 0211  NA 140 142 142  K 3.4* 3.9 3.5  CL 110 114* 113*  CO2 21* 22 21*  GLUCOSE 105* 98 85  BUN 13 12 10   CREATININE 0.87 0.80 0.78  CALCIUM 8.0* 7.7* 7.7*    LFT  Recent Labs    11/16/17 1301 11/17/17 0413  PROT 6.5 5.2*  ALBUMIN 2.7* 2.2*  AST 41 35  ALT 18 15  ALKPHOS 67 46  BILITOT 2.7* 2.6*    PT/INR  Recent Labs    11/16/17 1301 11/18/17 0211  LABPROT 24.2* 25.2*  INR 2.20 2.31    Serum ammonia 34.  Assessment:  #1.  Esophageal variceal bleed.  Patient underwent esophageal variceal banding yesterday.  Has received 2 units of PRBCs so far.  He is hemodynamically stable and does not appear to be bleeding.  Earlier today he passed what would appear to be old blood.  Octreotide dose was increased earlier today.  #2.  Acute on chronic hepatic encephalopathy triggered by blood in the GI tract.  Serum ammonia is normal and he is not confused like yesterday.  #3.   End-stage alcoholic cirrhosis.  Given that he has hepatic encephalopathy and esophageal variceal bleed short and long-term prognosis poor.  Meld score is 19.     Recommendations:  Advance diet to full liquids. H&H tonight. CBC metabolic 7 and INR with a.m. Lab. IV fluid changed to 50 mL/h.

## 2017-11-18 NOTE — Consult Note (Signed)
Consultation Note Date: 11/18/2017   Patient Name: Bradley Beltran.  DOB: 08-31-1940  MRN: [id number]  Age / Sex: 77 y.o., male  PCP: Oval Linsey, MD Referring Physician: Oval Linsey, MD  Reason for Consultation: Establishing goals of care  HPI/Patient Profile: 77 y.o. male  with past medical history of cirrhosis of the liver due to alcohol abuse in the past, anemia, cirrhosis, hepatic encephalopathy admitted on 11/16/2017 with  Acute hepatic encephalopathy and GI bleed.   Clinical Assessment and Goals of Care: Mr. Bartnick is resting in bed being attended by nursing staff.  He has had a bowel movement that appears to be fresh blood with some stringy material.  He makes and keeps eye contact, is calm and cooperative not fearful, pleasant. Meeting with wife and son in the family waiting area.  We talked about Mr. Maclaughlin current health conditions including his current bleeding, EGD results, serial hemoglobin results.  We talked about how to make choices for loved ones including 1) keeping them at the center of decision-making 2) are we doing something for them or to them, can we change what is happening 3) what would the Mr. Muccio of 10 years ago tell us about how to care for Mr. Glendening now.  Family shares that Mr. Denn preferred place of death is home.    HCPOA NEXT OF KIN - wife and adult son and daughter.     SUMMARY OF RECOMMENDATIONS   24- 48 hours for outcomes.   Code Status/Advance Care Planning:  DNR  Symptom Management:   Per PCP, no additional needs  Palliative Prophylaxis:   Frequent Pain Assessment and Turn Reposition  Additional Recommendations (Limitations, Scope, Preferences):  continue to treat the treatable, no CPR, no intubation, considering whether to have surgery in future.   Psycho-social/Spiritual:   Desire for further Chaplaincy  support:yes  Additional Recommendations: Caregiving  Support/Resources and Education on Hospice  Prognosis:   Unable to determine, based on outcomes. In hospital death would not be surprising.  Family desire for at home death if possible.   Discharge Planning: to be determined, based on outcomes.       Primary Diagnoses: Present on Admission: . Alcoholic cirrhosis (HCC) . Acute hepatic encephalopathy . Normocytic anemia due to blood loss . GIB (gastrointestinal bleeding)   I have reviewed the medical record, interviewed the patient and family, and examined the patient. The following aspects are pertinent.  Past Medical History:  Diagnosis Date  . Anemia   . Cirrhosis with alcoholism (HCC)   . Elevated bilirubin   . Hepatic encephalopathy (HCC)   . Thrombocytopenia (HCC)    Social History   Socioeconomic History  . Marital status: Married    Spouse name: Not on file  . Number of children: Not on file  . Years of education: Not on file  . Highest education level: Not on file  Occupational History  . Not on file  Social Needs  . Financial resource strain: Not on file  . Food insecurity:  Worry: Not on file    Inability: Not on file  . Transportation needs:    Medical: Not on file    Non-medical: Not on file  Tobacco Use  . Smoking status: Never Smoker  . Smokeless tobacco: Never Used  Substance and Sexual Activity  . Alcohol use: No    Alcohol/week: 0.0 oz    Comment: NO etoh since February, 2014  . Drug use: No  . Sexual activity: Yes    Birth control/protection: None  Lifestyle  . Physical activity:    Days per week: Not on file    Minutes per session: Not on file  . Stress: Not on file  Relationships  . Social connections:    Talks on phone: Not on file    Gets together: Not on file    Attends religious service: Not on file    Active member of club or organization: Not on file    Attends meetings of clubs or organizations: Not on file     Relationship status: Not on file  Other Topics Concern  . Not on file  Social History Narrative  . Not on file   History reviewed. No pertinent family history. Scheduled Meds: . sodium chloride   Intravenous Once  . feeding supplement (PRO-STAT SUGAR FREE 64)  30 mL Oral TID  . folic acid  1 mg Oral Daily  . lactulose  20 g Oral TID  . mouth rinse  15 mL Mouth Rinse BID  . multivitamin with minerals  1 tablet Oral Daily  . sodium chloride flush  3 mL Intravenous Q12H  . thiamine  100 mg Oral Daily   Continuous Infusions: . sodium chloride 250 mL (11/17/17 1807)  . cefTRIAXone (ROCEPHIN)  IV Stopped (11/18/17 1000)  . octreotide  (SANDOSTATIN)    IV infusion 50 mcg/hr (11/18/17 1149)  . pantoprozole (PROTONIX) infusion 8 mg/hr (11/18/17 0516)   PRN Meds:.sodium chloride, albuterol, ondansetron **OR** ondansetron (ZOFRAN) IV, sodium chloride flush Medications Prior to Admission:  Prior to Admission medications   Medication Sig Start Date End Date Taking? Authorizing Provider  albuterol (PROVENTIL HFA;VENTOLIN HFA) 108 (90 Base) MCG/ACT inhaler Inhale 2 puffs into the lungs 4 (four) times daily. 09/07/17  Yes [provider]  cholecalciferol (VITAMIN D) 1000 units tablet Take 1,000 Units by mouth daily.    Yes [provider]  CONSTULOSE 10 GM/15ML solution TAKE 15 ML BY MOUTH THREE TIMES DAILY 09/29/17  Yes Setzer, Terri L, NP  Multiple Vitamins-Minerals (MULTIVITAL) tablet Take 1 tablet by mouth daily. S.S.S Tonic   Yes [provider]  oxyCODONE (ROXICODONE) 5 MG immediate release tablet Take 0.5 tablets (2.5 mg total) by mouth every 4 (four) hours as needed for pain. Patient taking differently: Take 5 mg by mouth 2 (two) times daily.  09/28/12  Yes Setzer, Brand Maleserri L, NP   No Known Allergies Review of Systems  Unable to perform ROS: Other    Physical Exam  Constitutional: No distress.  Makes and keeps eye contact.   HENT:  Head: Atraumatic.   Cardiovascular: Normal rate.  Pulmonary/Chest: Effort normal. No respiratory distress.  Abdominal: Soft. He exhibits distension. There is no guarding.  Musculoskeletal: He exhibits no edema.  Frail and thin  Neurological: He is alert.  Skin: Skin is warm and dry.  Psychiatric:  Not fearful.   Nursing note and vitals reviewed.   Vital Signs: BP 128/67   Pulse (!) 59   Temp 97.6 F (36.4 C)  Resp (!) 23   Ht 5\' 7"  (1.702 m)   Wt 63.4 kg (139 lb 12.4 oz)   SpO2 95%   BMI 21.89 kg/m  Pain Scale: 0-10   Pain Score: 0-No pain   SpO2: SpO2: 95 % O2 Device:SpO2: 95 % O2 Flow Rate: .O2 Flow Rate (L/min): 10 L/min  IO: Intake/output summary:   Intake/Output Summary (Last 24 hours) at 11/18/2017 1350 Last data filed at 11/17/2017 1710 Gross per 24 hour  Intake 964.74 ml  Output -  Net 964.74 ml    LBM: Last BM Date: 11/16/17 Baseline Weight: Weight: 67.6 kg (149 lb) Most recent weight: Weight: 63.4 kg (139 lb 12.4 oz)     Palliative Assessment/Data:   Flowsheet Rows     Most Recent Value  Intake Tab  Referral Department  Hospitalist  Unit at Time of Referral  Intermediate Care Unit  Palliative Care Primary Diagnosis  Other (Comment) [GIB]  Date Notified  11/18/17  Palliative Care Type  New Palliative care  Reason for referral  Clarify Goals of Care  Date of Admission  11/16/17  Date first seen by Palliative Care  11/18/17  # of days Palliative referral response time  0 Day(s)  # of days IP prior to Palliative referral  2  Clinical Assessment  Palliative Performance Scale Score  40%  Pain Max last 24 hours  Not able to report  Pain Min Last 24 hours  Not able to report  Dyspnea Max Last 24 Hours  Not able to report  Dyspnea Min Last 24 hours  Not able to report  Psychosocial & Spiritual Assessment  Palliative Care Outcomes  Patient/Family meeting held?  Yes  Who was at the meeting?  pt at bedside, wife and son in family room  Patient/Family wishes:  Interventions discontinued/not started   Mechanical Ventilation      Time In: 1230 Time Out: 1340 Time Total: 70 minutes Greater than 50%  of this time was spent counseling and coordinating care related to the above assessment and plan.  Signed by: Katheran Awe, NP   Please contact Palliative Medicine Team phone at [phone] for questions and concerns.  For individual provider: See Loretha Stapler

## 2017-11-18 NOTE — Progress Notes (Signed)
Initial Nutrition Assessment  DOCUMENTATION CODES:   Severe malnutrition in context of chronic illness  INTERVENTION:  When diet is fully advanced- Low sodium/small frequent meals   Nursing provide Boost Breeze TID and ProStat 30 ml TID (each 30 ml provides 100 kcal, 15 gr protein) pending full diet advancement.    Provided handout: Cirrhosis Nutrition Therapy  NUTRITION DIAGNOSIS:  (S) Severe Malnutrition related to altered GI function, chronic illness, acute illness, poor appetite(Chronic alchoholic cirrhosis, bleeding esphageal varices newly banded) as evidenced by percent weight loss, energy intake < or equal to 75% for > or equal to 1 month, moderate fat depletion, moderate muscle depletion, severe muscle depletion.  GOAL:  Patient will meet greater than or equal to 90% of their needs    MONITOR:  Diet advancement, PO intake, Supplement acceptance, Labs, Weight trends, I & O's    REASON FOR ASSESSMENT:  Malnutrition Screening Tool    ASSESSMENT: Patient presents to ED complaining of increased weakness, confusion. History of anemia,  alcoholic cirrhosis- ammonia 55 on admission. Referred to GI due to suspect bleeding. Patient received 1 unit PRBC's. He is s/p EGD- findings/intervention: 3 columns of esophageal varices (banded), changes in the mucosa of the stomach and duodenum.    Diet advanced to clears today. Patient consumed 50% of broth and orange juice. At home he has been eating very little for the past several weeks (mostly snacks-likes ice cream) and has been drinking some Ensures per daughter. He takes lactulose daily. Expect based on nutrition hx he has been meeting < 75% of estimated energy needs for >/= 1 month.   Talked with nursing who affirms that he accepted the Boost Breeze this morning and RD encouraged pt to consume 100% of these while diet advancement is pending.  Patient has unplanned weight loss over the past 2 years 10 lb loss in 2018, and another 10 lb  (7%) in the past 3 months. He has moderate and severe muscle loss upper and lower extremities.   Labs: BMP Latest Ref Rng & Units 11/18/2017 11/17/2017 11/16/2017  Glucose 70 - 99 mg/dL 85 98 161(W105(H)  BUN 8 - 23 mg/dL 10 12 13   Creatinine 0.61 - 1.24 mg/dL 9.600.78 4.540.80 0.980.87  Sodium 135 - 145 mmol/L 142 142 140  Potassium 3.5 - 5.1 mmol/L 3.5 3.9 3.4(L)  Chloride 98 - 111 mmol/L 113(H) 114(H) 110  CO2 22 - 32 mmol/L 21(L) 22 21(L)  Calcium 8.9 - 10.3 mg/dL 7.7(L) 7.7(L) 8.0(L)    Medications reviewed and include: lactulose, folvite, MVI, thiamine and protonix.  NUTRITION - FOCUSED PHYSICAL EXAM:    Most Recent Value  Orbital Region  Mild depletion  Upper Arm Region  Mild depletion  Buccal Region  No depletion  Temple Region  Mild depletion  Clavicle Bone Region  Moderate depletion  Clavicle and Acromion Bone Region  Moderate depletion  Dorsal Hand  No depletion  Patellar Region  Severe depletion  Anterior Thigh Region  Severe depletion  Posterior Calf Region  Moderate depletion  Edema (RD Assessment)  None  Eyes  Reviewed  Skin  Reviewed [jaundice, dry]  Nails  Reviewed      Diet Order:   Diet Order           Diet clear liquid Room service appropriate? Yes; Fluid consistency: Thin  Diet effective now          EDUCATION NEEDS:   Not appropriate for education at this time Skin:  Skin Assessment: Reviewed RN Assessment(jaundice)  Last BM:  7/29- type 7 large liquid stool  Height:   Ht Readings from Last 1 Encounters:  11/16/17 5\' 7"  (1.702 m)    Weight:   Wt Readings from Last 1 Encounters:  11/18/17 139 lb 12.4 oz (63.4 kg)    Ideal Body Weight:  67 kg  BMI:  Body mass index is 21.89 kg/m.  Estimated Nutritional Needs:   Kcal:  1610-9604 (28-31 kcal/kg/bw)  Protein:  75-82 gr (1.2-1.3 gr/kg/bw)  Fluid:  1.8-2.0 liters daily   Royann Shivers MS,RD,CSG,LDN Office: [phone] Pager: [phone]

## 2017-11-19 LAB — PROTIME-INR
INR: 2.31
Prothrombin Time: 25.2 seconds — ABNORMAL HIGH (ref 11.4–15.2)

## 2017-11-19 LAB — CBC
HEMATOCRIT: 28.5 % — AB (ref 39.0–52.0)
HEMOGLOBIN: 9.1 g/dL — AB (ref 13.0–17.0)
MCH: 29 pg (ref 26.0–34.0)
MCHC: 31.9 g/dL (ref 30.0–36.0)
MCV: 90.8 fL (ref 78.0–100.0)
Platelets: 73 10*3/uL — ABNORMAL LOW (ref 150–400)
RBC: 3.14 MIL/uL — ABNORMAL LOW (ref 4.22–5.81)
RDW: 22.1 % — ABNORMAL HIGH (ref 11.5–15.5)
WBC: 4.5 10*3/uL (ref 4.0–10.5)

## 2017-11-19 LAB — BASIC METABOLIC PANEL
ANION GAP: 3 — AB (ref 5–15)
BUN: 11 mg/dL (ref 8–23)
CO2: 25 mmol/L (ref 22–32)
CREATININE: 0.74 mg/dL (ref 0.61–1.24)
Calcium: 7.5 mg/dL — ABNORMAL LOW (ref 8.9–10.3)
Chloride: 116 mmol/L — ABNORMAL HIGH (ref 98–111)
GFR calc non Af Amer: >60 mL/min (ref >60)
Glucose, Bld: 100 mg/dL — ABNORMAL HIGH (ref 70–99)
POTASSIUM: 3.2 mmol/L — AB (ref 3.5–5.1)
SODIUM: 144 mmol/L (ref 135–145)

## 2017-11-19 LAB — H. PYLORI ANTIBODY, IGG: H Pylori IgG: <0.8 Index Value (ref 0.00–0.79)

## 2017-11-19 MED ORDER — LACTULOSE 10 GM/15ML PO SOLN
20.0000 g | Freq: Two times a day (BID) | ORAL | Status: DC
  Administered 2017-11-19 – 2017-11-21 (×4): 20 g via ORAL
  Filled 2017-11-19 (×4): qty 30

## 2017-11-19 MED ORDER — PANTOPRAZOLE SODIUM 40 MG PO TBEC
40.0000 mg | DELAYED_RELEASE_TABLET | Freq: Two times a day (BID) | ORAL | Status: DC
  Administered 2017-11-19 – 2017-11-21 (×4): 40 mg via ORAL
  Filled 2017-11-19 (×4): qty 1

## 2017-11-19 MED ORDER — SODIUM CHLORIDE 0.9 % IV SOLN: 250.0000 mL | INTRAVENOUS | Status: DC | PRN

## 2017-11-19 NOTE — Clinical Social Work Note (Signed)
Received CSW consult indicating pt and family would like for pt to return home with hospice at dc. Updated RN CM, Aviva SignsSharley, who will assist pt/family with this request.  Will clear this consult and sign off.

## 2017-11-19 NOTE — Progress Notes (Signed)
  Subjective:  Patient has no complaints.  States his appetite is back.  He enjoyed ice cream last night and he is drinking supplement. His stools are not watery.  No melena or rectal bleeding.  Objective: Blood pressure 125/71, pulse 66, temperature 98.4 F (36.9 C), temperature source Oral, resp. rate 16, height '5\' 7"'$  (1.702 m), weight 139 lb 12.4 oz (63.4 kg), SpO2 97 %. Patient is alert he is sitting in a reclining chair.. Asterixis absent. He knows he is at any point hospital. Abdomen is full but soft and nontender and with palpable spleen. No LE edema or clubbing noted.  Labs/studies Results:  Recent Labs    December 09, 2017 1301 11/17/17 0413  11/18/17 1224 11/18/17 2037 11/19/17 0507  WBC 10.6* 4.6  --   --   --  4.5  HGB 8.5* 7.7*   < > 9.5* 9.1* 9.1*  HCT 27.3* 24.8*   < > 30.0* 28.1* 28.5*  PLT 138* 76*  --   --   --  73*   < > = values in this interval not displayed.    BMET  Recent Labs    11/17/17 0413 11/18/17 0211 11/19/17 0507  NA 142 142 144  K 3.9 3.5 3.2*  CL 114* 113* 116*  CO2 22 21* 25  GLUCOSE 98 85 100*  BUN '12 10 11  '$ CREATININE 0.80 0.78 0.74  CALCIUM 7.7* 7.7* 7.5*    LFT  Recent Labs    12/09/2017 1301 11/17/17 0413  PROT 6.5 5.2*  ALBUMIN 2.7* 2.2*  AST 41 35  ALT 18 15  ALKPHOS 67 46  BILITOT 2.7* 2.6*    PT/INR  Recent Labs    11/18/17 0211 11/19/17 0507  LABPROT 25.2* 25.2*  INR 2.31 2.31    H. pylori serology pending  Assessment:  #1.  Upper GI esophageal variceal bleed.  No evidence of recurrent bleed.  Patient is hemodynamically stable.  #2.  Acute on chronic anemia secondary to upper GI bleed.  Patient has received 2 units of PRBCs.  #3.  Hepatic encephalopathy.  He is back at his baseline.  He was having loose stools last night.  Will back off on lactulose dose.  #4.  Decompensated alcoholic cirrhosis.  Patient and family is decided not to pursue with taps or other interventions if bleeding recurs. #5.   Hypokalemia.  Recommendations:  Decrease IV fluid rate to KVO. DC pantoprazole infusion when this bag runs out and begin pantoprazole 40 mg p.o. twice daily. Decrease octreotide infusion to 25 mcg/h and plan on DC it tomorrow. We will start him on low-dose propranolol when octreotide infusion stopped. Advance diet to heart healthy diet. Decrease lactulose to 20 g p.o. twice daily. KCl liquid 20 mg twice daily x4 doses. CBC be met and INR in a.m.

## 2017-11-19 NOTE — Progress Notes (Signed)
Pt has done well this morning. Ate 50% full liquid diet for breakfast. Removed rectal tube and transferred 1 person assist up to bedside chair. Dr. Karilyn Cotaehman in to see family and pt and voiced that he was pleased with labs at this time. Dr. Karilyn Cotaehman also stated that pt would likely go home on Saturday 8/3 if pt continues to do well. Early during the shift the daughter had voiced wishes to send pt home on hospice care and that she didn't see her dad "going through this again." I notified Dr. Janna Archondiego via page and also consulted social work.

## 2017-11-19 NOTE — Progress Notes (Signed)
Patient continues to improve slowly clinically.  Will be out of bed to chair 3 times daily today heart and oriented x3 Long discussion with the family everyone present about hospice requirements and appropriateness at this point.  We have jointly decided to allow him to go home with home health nurse and physical therapy and with the opportunity to continue to improve and should he deteriorate we will consider home hospice at that time everyone seems to agreed Bradley DoneJoseph G Mullings Jr. [id number] DOB: 12-15-1940 DOA: 11/16/2017 PCP: [name], Jimmi Sidener, MD   Physical Exam: Blood pressure 128/73, pulse (!) 53, temperature 97.9 F (36.6 C), temperature source Oral, resp. rate 12, height 5\' 7"  (1.702 m), weight 63.4 kg (139 lb 12.4 oz), SpO2 98 %.   Investigations:  Recent Results (from the past 240 hour(s))  Urine culture     Status: None   Collection Time: 11/16/17  2:05 PM  Result Value Ref Range Status   Specimen Description   Final    URINE, CATHETERIZED Performed at [facility], [address] [place], [place] [address]    Special Requests   Final    NONE Performed at [facility], [address] [place], [place] [address]    Culture   Final    NO GROWTH Performed at [facility], [address] [place], [place] [address]    Report Status 11/18/2017 FINAL  Final  MRSA PCR Screening     Status: None   Collection Time: 11/16/17  5:09 PM  Result Value Ref Range Status   MRSA by PCR NEGATIVE NEGATIVE Final    Comment:        The GeneXpert MRSA Assay (FDA approved for NASAL specimens only), is one component of a comprehensive MRSA colonization surveillance program. It is not intended to diagnose MRSA infection nor to guide or monitor treatment for MRSA infections. Performed at [facility], [address] [place], [place] [address]      Basic Metabolic Panel: Recent Labs    11/18/17 0211 11/19/17 0507  NA 142 144  K 3.5 3.2*  CL 113* 116*  CO2 21* 25  GLUCOSE  85 100*  BUN 10 11  CREATININE 0.78 0.74  CALCIUM 7.7* 7.5*   Liver Function Tests: Recent Labs    11/17/17 0413  AST 35  ALT 15  ALKPHOS 46  BILITOT 2.6*  PROT 5.2*  ALBUMIN 2.2*     CBC: Recent Labs    11/17/17 0413  11/18/17 2037 11/19/17 0507  WBC 4.6  --   --  4.5  HGB 7.7*   < > 9.1* 9.1*  HCT 24.8*   < > 28.1* 28.5*  MCV 89.9  --   --  90.8  PLT 76*  --   --  73*   < > = values in this interval not displayed.    Mr Laqueta JeanBrain W Wo Contrast  Result Date: 11/17/2017 CLINICAL DATA:  Altered mental status. EXAM: MRI HEAD WITHOUT AND WITH CONTRAST TECHNIQUE: Multiplanar, multiecho pulse sequences of the brain and surrounding structures were obtained without and with intravenous contrast. CONTRAST:  10mL MULTIHANCE GADOBENATE DIMEGLUMINE 529 MG/ML IV SOLN COMPARISON:  Head CT 11/16/2017 Brain MRI 02/26/2004 FINDINGS: The study is degraded by motion, despite efforts to reduce this artifact, including utilization of motion-resistant MR sequences. The findings of the study are interpreted in the context of reduced sensitivity/specificity. BRAIN: There is no acute infarct, acute hemorrhage or mass effect. Early confluent hyperintense T2-weighted signal of the  periventricular and deep white matter, most commonly due to chronic ischemic microangiopathy. Generalized atrophy without lobar predilection. VASCULAR: Major intracranial arterial and venous sinus flow voids are preserved. SKULL AND UPPER CERVICAL SPINE: The visualized skull base, calvarium, upper cervical spine and extracranial soft tissues are normal. SINUSES/ORBITS: No fluid levels or advanced mucosal thickening. No mastoid or middle ear effusion. The orbits are normal. IMPRESSION: 1. Severely motion degraded examination. 2. Within that limitation, no acute intracranial abnormality. 3. Chronic ischemic microangiopathy and generalized atrophy. Electronically Signed   By: Deatra Robinson M.D.   On: 11/17/2017 14:33       Medications  Impression:  Principal Problem:   Acute hepatic encephalopathy Active Problems:   Alcoholic cirrhosis (HCC)   Normocytic anemia due to blood loss   GIB (gastrointestinal bleeding)   Protein-calorie malnutrition, severe   Goals of care, counseling/discussion   Palliative care by specialist   Encounter for hospice care discussion     Plan: Out of bed to chair 3 times daily continue current therapy serial hemoglobins hematocrits every 12 hours  Consultants: Raman and palliative care   Procedures   Antibiotics:           Time spent: 45 minutes   LOS: 3 days   Cedar Ditullio M   11/19/2017, 1:33 PM

## 2017-11-19 NOTE — Care Management Note (Addendum)
Case Management Note  Patient Details  Name: Bradley Beltran. MRN: [id number] Date of Birth: 1940-07-04  Subjective/Objective:    Esophageal variceal bleed.  Patient underwent esophageal variceal banding. Acute on chronic hepatic encephalopathy. CM notified by CSW that patient/family are interested in patient going home with hospice. Entered room, introduced myself to patient. When I began to talk about hospice, family reported that patient did not understand.  Patient alert and oriented but clearly has not had any conversations about hospice.  We discuss briefly what hospice is and what services would be provided. Patient defers to family during conversation. Wife and daughter seem ready for hospice services, even selecting a hospice ([facility]).                  Action/Plan: Conversation with attending. He will discuss hospice/future treatment plans with patient and family.  CM following for needs.   ADDENDUM 11/20/2017 1100: Met with patient and wife. They have discussed with Dr. Cindie Laroche and agree to home health at this time.  Offered choice of home health agencies, provided list. No preference of agency. Vaughan Basta of [facility] notified and will obtain orders via Epic. Anticipate DC tomorrow.  Expected Discharge Date:  11/18/17               Expected Discharge Plan:  Home/Self Care  In-House Referral:  Clinical Social Work, Hospice / Palliative Care  Discharge planning Services  CM Consult  Post Acute Care Choice:   patient, family Choice offered to:     DME Arranged:    DME Agency:     HH Arranged:   RN, PT [place] Agency:   [place]  Status of Service:  In process, will continue to follow  If discussed at Long Length of Stay Meetings, dates discussed:    Additional Comments:  Andrej Spagnoli, Chauncey Reading, RN 11/19/2017, 1:44 PM

## 2017-11-20 LAB — BASIC METABOLIC PANEL
ANION GAP: 4 — AB (ref 5–15)
BUN: 12 mg/dL (ref 8–23)
CALCIUM: 7.3 mg/dL — AB (ref 8.9–10.3)
CO2: 23 mmol/L (ref 22–32)
CREATININE: 0.68 mg/dL (ref 0.61–1.24)
Chloride: 117 mmol/L — ABNORMAL HIGH (ref 98–111)
GFR calc non Af Amer: >60 mL/min (ref >60)
Glucose, Bld: 88 mg/dL (ref 70–99)
Potassium: 3.3 mmol/L — ABNORMAL LOW (ref 3.5–5.1)
Sodium: 144 mmol/L (ref 135–145)

## 2017-11-20 LAB — CBC
HCT: 32.6 % — ABNORMAL LOW (ref 39.0–52.0)
Hemoglobin: 10.2 g/dL — ABNORMAL LOW (ref 13.0–17.0)
MCH: 29 pg (ref 26.0–34.0)
MCHC: 31.3 g/dL (ref 30.0–36.0)
MCV: 92.6 fL (ref 78.0–100.0)
PLATELETS: 96 10*3/uL — AB (ref 150–400)
RBC: 3.52 MIL/uL — ABNORMAL LOW (ref 4.22–5.81)
RDW: 23.1 % — AB (ref 11.5–15.5)
WBC: 7.3 10*3/uL (ref 4.0–10.5)

## 2017-11-20 LAB — AMMONIA: Ammonia: 38 umol/L — ABNORMAL HIGH (ref 9–35)

## 2017-11-20 MED ORDER — POTASSIUM CHLORIDE 10 MEQ/100ML IV SOLN
10.0000 meq | INTRAVENOUS | Status: AC
  Administered 2017-11-20 (×4): 10 meq via INTRAVENOUS
  Filled 2017-11-20 (×5): qty 100

## 2017-11-20 MED ORDER — PROPRANOLOL HCL 20 MG PO TABS
10.0000 mg | ORAL_TABLET | Freq: Two times a day (BID) | ORAL | Status: DC
  Administered 2017-11-20 – 2017-11-21 (×3): 10 mg via ORAL
  Filled 2017-11-20 (×3): qty 1

## 2017-11-20 MED ORDER — DILTIAZEM HCL-DEXTROSE 100-5 MG/100ML-% IV SOLN (PREMIX): 5.0000 mg/h | INTRAVENOUS | Status: DC

## 2017-11-20 NOTE — Progress Notes (Signed)
Patient with hepatic encephalopathy status post GI bleed with EGD no evidence of further bleed at present hemoglobin holding stable being measured every 12 hours potassium somewhat low being repleted intravenously with 4 runs of 10 mEq IV will check ammonia level today had be met and CBC in a.m. considering discharge depending on patient's strength and ability to ambulate with home health care and physical therapy [name]. [id number]:[id number] DOB: 24-Jun-1940 DOA: 11/16/2017 PCP: Lucia Gaskins, MD   Physical Exam: Blood pressure 122/69, pulse 72, temperature 98.2 F (36.8 C), temperature source Oral, resp. rate 20, height 5' 7" (1.702 m), weight 70.3 kg (154 lb 15.7 oz), SpO2 91 %.  Lungs clear to A&P no rales wheeze rhonchi heart regular rhythm no murmurs gallops heaves or rubs abdomen soft nontender bowel sounds normoactive   Investigations:  Recent Results (from the past 240 hour(s))  Urine culture     Status: None   Collection Time: 11/16/17  2:05 PM  Result Value Ref Range Status   Specimen Description   Final    URINE, CATHETERIZED Performed at [facility], [address], [place], [place] [address]    Special Requests   Final    NONE Performed at [facility], [address], [place], [place] [address]    Culture   Final    NO GROWTH Performed at [facility], [place] [address], [place], [place] [address]    Report Status 11/18/2017 FINAL  Final  MRSA PCR Screening     Status: None   Collection Time: 11/16/17  5:09 PM  Result Value Ref Range Status   MRSA by PCR NEGATIVE NEGATIVE Final    Comment:        The GeneXpert MRSA Assay (FDA approved for NASAL specimens only), is one component of a comprehensive MRSA colonization surveillance program. It is not intended to diagnose MRSA infection nor to guide or monitor treatment for MRSA infections. Performed at [facility], [address] [place], [place] [address]      Basic Metabolic Panel: Recent Labs    11/19/17 0507 11/20/17 0408  NA 144 144  K 3.2* 3.3*  CL 116* 117*  CO2 25 23  GLUCOSE 100* 88  BUN 11 12  CREATININE 0.74 0.68  CALCIUM 7.5* 7.3*   Liver Function Tests: No results for input(s): AST, ALT, ALKPHOS, BILITOT, PROT, ALBUMIN in the last 72 hours.   CBC: Recent Labs    11/18/17 2037 11/19/17 0507  WBC  --  4.5  HGB 9.1* 9.1*  HCT 28.1* 28.5*  MCV  --  90.8  PLT  --  73*    No results found.    Medications:   Impression:  Principal Problem:   Acute hepatic encephalopathy Active Problems:   Alcoholic cirrhosis (HCC)   Normocytic anemia due to blood loss   GIB (gastrointestinal bleeding)   Protein-calorie malnutrition, severe   Goals of care, counseling/discussion   Palliative care by specialist   Encounter for hospice care discussion     Plan: Check ammonia level today hemoglobin hematocrit every 12 hours KCl to 10 mEq IV x4 today  Consultants: Gastroenterology   Procedures EGD and gastric banding   Antibiotics:         Time spent: 30 minutes   LOS: 4 days   Ahmani Daoud M   11/20/2017, 12:11 PM

## 2017-11-20 NOTE — Care Management Important Message (Signed)
Important Message  Patient Details  Name: Bradley DoneJoseph G Schaab Jr. MRN: [id number] Date of Birth: 1941-04-14   Medicare Important Message Given:  Yes    Renie OraHawkins, Pascuala Klutts Smith 11/20/2017, 10:32 AM

## 2017-11-20 NOTE — Progress Notes (Signed)
  Subjective:  Patient has no complaints.  He is hoping to go home tomorrow.  He denies nausea vomiting or abdominal pain.  His wife has noted his abdomen to be distended.  Patient denies abdominal pain melena or rectal bleeding.  He had 2 bowel movements today.  Objective: Blood pressure 122/69, pulse 72, temperature 98.2 F (36.8 C), temperature source Oral, resp. rate 20, height 5\' 7"  (1.702 m), weight 154 lb 15.7 oz (70.3 kg), SpO2 91 %. Patient is alert and oriented to place person and time. He does not have asterixis. Cardiac exam with regular rhythm normal S1-S2.  No murmur gallop noted. Lungs are clear to auscultation. Abdomen is highly distended but soft and without tenderness.  He has small umbilical hernia. No peripheral edema noted.   Labs/studies Results:  Recent Labs    11/18/17 1224 11/18/17 2037 11/19/17 0507  WBC  --   --  4.5  HGB 9.5* 9.1* 9.1*  HCT 30.0* 28.1* 28.5*  PLT  --   --  73*    BMET  Recent Labs    11/18/17 0211 11/19/17 0507 11/20/17 0408  NA 142 144 144  K 3.5 3.2* 3.3*  CL 113* 116* 117*  CO2 21* 25 23  GLUCOSE 85 100* 88  BUN 10 11 12   CREATININE 0.78 0.74 0.68  CALCIUM 7.7* 7.5* 7.3*    LFT  No results for input(s): PROT, ALBUMIN, AST, ALT, ALKPHOS, BILITOT, BILIDIR, IBILI in the last 72 hours.  PT/INR  Recent Labs    11/18/17 0211 11/19/17 0507  LABPROT 25.2* 25.2*  INR 2.31 2.31    Serum ammonia 38.  Assessment:  #1.  Two unit esophageal variceal bleed.  He underwent esophageal variceal banding on 11/17/2017.  He was weaned off octreotide infusion earlier today.  #2.  Acute on chronic anemia secondary to acute GI bleed.  Patient has received 2 units of PRBCs during this admission.  H&H from today is pending.  #3.  Hepatic encephalopathy.  He appears to be back at his baseline.  #4.  Mild hypokalemia.  He is getting p.o. KCl.  #5.  Patient has developed mild ascites during this hospitalization.  He has a history of  ascites but it has gradually resolved and he has not been taking diuretics for several months.   Recommendations:  Patient begun on propranolol 10 mg p.o. twice daily. Continue pantoprazole at 10 mg p.o. twice daily. Patient advised not to take aspirin or other OTC NSAIDs. Patient may need to be back on furosemide 20 mg Monday Wednesday Friday and Spironolactone 100 mg p.o. Daily.  Dr. Janna Archondiego will make that determination in a.m. We will plan to see patient back in the office in few weeks and arrange for follow-up EGD. Once again patient reminded that he must not to drink alcohol.

## 2017-11-20 NOTE — Care Management Important Message (Signed)
Important Message  Patient Details  Name: Bradley DoneJoseph G Trager Jr. MRN: [id number] Date of Birth: 1941-04-13   Medicare Important Message Given:  Yes    Renie OraHawkins, Gerardo Territo Smith 11/20/2017, 10:31 AM

## 2017-11-21 LAB — CBC
HCT: 28.9 % — ABNORMAL LOW (ref 39.0–52.0)
Hemoglobin: 9.1 g/dL — ABNORMAL LOW (ref 13.0–17.0)
MCH: 29.3 pg (ref 26.0–34.0)
MCHC: 31.5 g/dL (ref 30.0–36.0)
MCV: 92.9 fL (ref 78.0–100.0)
PLATELETS: 80 10*3/uL — AB (ref 150–400)
RBC: 3.11 MIL/uL — AB (ref 4.22–5.81)
RDW: 22.8 % — ABNORMAL HIGH (ref 11.5–15.5)
WBC: 6.5 10*3/uL (ref 4.0–10.5)

## 2017-11-21 LAB — BASIC METABOLIC PANEL
Anion gap: <3 — ABNORMAL LOW (ref 5–15)
BUN: 13 mg/dL (ref 8–23)
CHLORIDE: 116 mmol/L — AB (ref 98–111)
CO2: 24 mmol/L (ref 22–32)
CREATININE: 0.77 mg/dL (ref 0.61–1.24)
Calcium: 7.1 mg/dL — ABNORMAL LOW (ref 8.9–10.3)
GFR calc Af Amer: >60 mL/min (ref >60)
Glucose, Bld: 79 mg/dL (ref 70–99)
Potassium: 3.5 mmol/L (ref 3.5–5.1)
SODIUM: 141 mmol/L (ref 135–145)

## 2017-11-21 LAB — PROTIME-INR
INR: 2.51
PROTHROMBIN TIME: 26.9 s — AB (ref 11.4–15.2)

## 2017-11-21 MED ORDER — FOLIC ACID 1 MG PO TABS: 1.0000 mg | ORAL_TABLET | Freq: Every day | ORAL | 2 refills | Status: AC

## 2017-11-21 MED ORDER — PANTOPRAZOLE SODIUM 40 MG PO TBEC: 40.0000 mg | DELAYED_RELEASE_TABLET | Freq: Two times a day (BID) | ORAL | 2 refills | Status: AC

## 2017-11-21 MED ORDER — POTASSIUM CHLORIDE CRYS ER 20 MEQ PO TBCR: 20.0000 meq | EXTENDED_RELEASE_TABLET | Freq: Every day | ORAL | 2 refills | Status: DC

## 2017-11-21 MED ORDER — PROPRANOLOL HCL 10 MG PO TABS: 10.0000 mg | ORAL_TABLET | Freq: Two times a day (BID) | ORAL | 1 refills | Status: AC

## 2017-11-21 MED ORDER — ADULT MULTIVITAMIN W/MINERALS CH: 1.0000 | ORAL_TABLET | Freq: Every day | ORAL | 6 refills | Status: DC

## 2017-11-21 MED ORDER — POTASSIUM CHLORIDE CRYS ER 20 MEQ PO TBCR
20.0000 meq | EXTENDED_RELEASE_TABLET | Freq: Every day | ORAL | Status: DC
  Administered 2017-11-21: 20 meq via ORAL
  Filled 2017-11-21: qty 1

## 2017-11-21 MED ORDER — PRO-STAT SUGAR FREE PO LIQD: 30.0000 mL | Freq: Three times a day (TID) | ORAL | 0 refills | Status: DC

## 2017-11-21 MED ORDER — LACTULOSE 10 GM/15ML PO SOLN: 20.0000 g | Freq: Two times a day (BID) | ORAL | 0 refills | Status: AC

## 2017-11-21 NOTE — Progress Notes (Signed)
2 IVs removed to left forearm, 2x2 gauze and paper tape applied to site, patient tolerated well.  Reviewed AVS with patient and patient's son-in-law, both verbalized understanding.  Patient transported home by son-in-law.

## 2017-11-21 NOTE — Discharge Summary (Signed)
Physician Discharge Summary  Bradley DoneJoseph G Menendez Jr. XBM:[id number] DOB: 08-25-1940 DOA: 11/16/2017  PCP: Bradley Beltran, Bradley Lapage, MD  Admit date: 11/16/2017 Discharge date: 11/21/2017   Recommendations for Outpatient Follow-up: Patient is instructed to go home with home health home health aide physical therapy to not take any opioid analgesia did not BiPAP any alcohol which he has not Beltran for 13 years and to take Protonix 40 mg p.o. daily to eat soft foods follow-up in my office in 48 to 72 hours time to assess hemoglobin electrolytes and hemodynamics as well as any evidence of continuing further recurrence of esophageal variceal bleed he is likewise instructed to take nutritional supplements 3 times a day for malnutrition and hypoproteinemia and hypoalbuminemia Discharge Diagnoses:  Principal Problem:   Acute hepatic encephalopathy Active Problems:   Alcoholic cirrhosis (HCC)   Normocytic anemia due to blood loss   GIB (gastrointestinal bleeding)   Protein-calorie malnutrition, severe   Goals of care, counseling/discussion   Palliative care by specialist   Encounter for hospice care discussion   Discharge Condition: Stable  Filed Weights   11/18/17 0404 11/20/17 0421 11/21/17 [id number]  Weight: 63.4 kg (139 lb 12.4 oz) 70.3 kg (154 lb 15.7 oz) 64.5 kg (142 lb 3.2 oz)    History of present illness:  Patient is 77 year old white male with 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  And came in with altered mental status obtunded and was found to have an elevated ammonia of 56 which is not that high for him.  Stools were heme positive he was seen in consultation by GI placed in ICU his hemoglobin was 7.1 lower than normal was transfused 2 units of packed cells GI elected to do an EGD  which they found esophageal erosions gastric erosions and esophageal varices 1 of which was banded he was monitored over the succeeding 4 days in ICU his hemoglobin hematocrit was stable being monitored every 12 hours his potassium was a little low and oral potassium informed K-Dur 20 milliequivalents was added daily a long discussion with family versus hospice care versus home care and home health nurse we elected not to do any hospital care in the future should he deteriorate however he seemed to be improving and we gave him the opportunity to do so at home with multiple systems to achieve that effect he is a no code at this time all the family and patient agree  Hospital Course:  See HPI above  Procedures:  EGD with esophageal variceal banding  Consultations:  Gastroenterology palliative care and physical therapy  Discharge Instructions  Discharge Instructions    Discharge instructions   Complete by:  As directed      Allergies as of 11/21/2017   No Known Allergies     Medication List    STOP taking these medications   oxyCODONE 5 MG immediate release tablet Commonly known as:  ROXICODONE     TAKE these medications   albuterol 108 (90 Base) MCG/ACT inhaler Commonly known as:  PROVENTIL HFA;VENTOLIN HFA Inhale 2 puffs into the lungs 4 (four) times daily.   cholecalciferol 1000 units tablet Commonly known as:  VITAMIN D Take 1,000 Units by mouth daily.   feeding supplement (PRO-STAT SUGAR FREE 64) Liqd Take 30 mLs by mouth 3 (three) times daily.   folic acid 1  MG tablet Commonly known as:  FOLVITE Take 1 tablet (1 mg total) by mouth daily. Start taking on:  11/22/2017   lactulose 10 GM/15ML solution Commonly known as:  CHRONULAC Take 30 mLs (20 g total) by mouth 2 (two) times daily. What changed:  See the new instructions.   multivitamin with minerals Tabs tablet Take 1 tablet by mouth daily. Start taking on:  11/22/2017 What changed:  additional instructions    pantoprazole 40 MG tablet Commonly known as:  PROTONIX Take 1 tablet (40 mg total) by mouth 2 (two) times daily.   potassium chloride SA 20 MEQ tablet Commonly known as:  K-DUR,KLOR-CON Take 1 tablet (20 mEq total) by mouth daily.   propranolol 10 MG tablet Commonly known as:  INDERAL Take 1 tablet (10 mg total) by mouth 2 (two) times daily.      No Known Allergies Follow-up Information    [facility] Follow up.   Why:  RN and PT Contact information: [address] [place] [address] [phone]           The results of significant diagnostics from this hospitalization (including imaging, microbiology, ancillary and laboratory) are listed below for reference.    Significant Diagnostic Studies: Dg Chest 2 View  Result Date: 11/16/2017 CLINICAL DATA:  Confusion EXAM: CHEST - 2 VIEW COMPARISON:  09/03/2012 FINDINGS: Small bilateral pleural effusions. Left greater than right bibasilar airspace disease. Normal heart size. No pneumothorax. IMPRESSION: Bilateral pleural effusions with bibasilar consolidations which may reflect atelectasis or pneumonia. Electronically Signed   By: Jasmine Pang M.D.   On: 11/16/2017 14:32   Ct Head Wo Contrast  Result Date: 11/16/2017 CLINICAL DATA:  Altered mental status. EXAM: CT HEAD WITHOUT CONTRAST CT CERVICAL SPINE WITHOUT CONTRAST TECHNIQUE: Multidetector CT imaging of the head and cervical spine was performed following the standard protocol without intravenous contrast. Multiplanar CT image reconstructions of the cervical spine were also generated. COMPARISON:  CT scan of August 08, 2004. FINDINGS: CT HEAD FINDINGS Brain: Mild diffuse cortical atrophy is noted. Mild chronic ischemic white matter disease is noted. No mass effect or midline shift is noted. Ventricular size is within normal limits. There is no evidence of mass lesion, hemorrhage or acute infarction. Vascular: No hyperdense vessel or unexpected  calcification. Skull: Normal. Negative for fracture or focal lesion. Sinuses/Orbits: No acute finding. Other: None. CT CERVICAL SPINE FINDINGS Alignment: Normal. Skull base and vertebrae: No acute fracture. No primary bone lesion or focal pathologic process. Soft tissues and spinal canal: No prevertebral fluid or swelling. No visible canal hematoma. Disc levels: There appears to be fusion of C3-4. Moderate degenerative disc disease is noted at C5-6 and C6-7. Upper chest: Large bilateral pleural effusions are noted. Other: Degenerative changes seen involving posterior facet joints bilaterally. IMPRESSION: Mild diffuse cortical atrophy. Mild chronic ischemic white matter disease. No acute intracranial abnormality seen. Multilevel degenerative disc disease. No acute abnormality seen in the cervical spine. Large bilateral pleural effusions are seen within the visualized lung apices. Chest radiograph is recommended for further evaluation. Electronically Signed   By: Lupita Raider, M.D.   On: 11/16/2017 15:06   Ct Cervical Spine Wo Contrast  Result Date: 11/16/2017 CLINICAL DATA:  Altered mental status. EXAM: CT HEAD WITHOUT CONTRAST CT CERVICAL SPINE WITHOUT CONTRAST TECHNIQUE: Multidetector CT imaging of the head and cervical spine was performed following the standard protocol without intravenous contrast. Multiplanar CT image reconstructions of the cervical spine were also generated. COMPARISON:  CT scan of August 08, 2004. FINDINGS: CT HEAD FINDINGS Brain: Mild diffuse cortical atrophy is noted. Mild chronic ischemic white matter disease is noted. No mass effect or midline shift is noted. Ventricular size is within normal limits. There is no evidence of mass lesion, hemorrhage or acute infarction. Vascular: No hyperdense vessel or unexpected calcification. Skull: Normal. Negative for fracture or focal lesion. Sinuses/Orbits: No acute finding. Other: None. CT CERVICAL SPINE FINDINGS Alignment: Normal. Skull base  and vertebrae: No acute fracture. No primary bone lesion or focal pathologic process. Soft tissues and spinal canal: No prevertebral fluid or swelling. No visible canal hematoma. Disc levels: There appears to be fusion of C3-4. Moderate degenerative disc disease is noted at C5-6 and C6-7. Upper chest: Large bilateral pleural effusions are noted. Other: Degenerative changes seen involving posterior facet joints bilaterally. IMPRESSION: Mild diffuse cortical atrophy. Mild chronic ischemic white matter disease. No acute intracranial abnormality seen. Multilevel degenerative disc disease. No acute abnormality seen in the cervical spine. Large bilateral pleural effusions are seen within the visualized lung apices. Chest radiograph is recommended for further evaluation. Electronically Signed   By: Lupita Raider, M.D.   On: 11/16/2017 15:06   Mr Laqueta Jean YQ Contrast  Result Date: 11/17/2017 CLINICAL DATA:  Altered mental status. EXAM: MRI HEAD WITHOUT AND WITH CONTRAST TECHNIQUE: Multiplanar, multiecho pulse sequences of the brain and surrounding structures were obtained without and with intravenous contrast. CONTRAST:  10mL MULTIHANCE GADOBENATE DIMEGLUMINE 529 MG/ML IV SOLN COMPARISON:  Head CT 11/16/2017 Brain MRI 02/26/2004 FINDINGS: The study is degraded by motion, despite efforts to reduce this artifact, including utilization of motion-resistant MR sequences. The findings of the study are interpreted in the context of reduced sensitivity/specificity. BRAIN: There is no acute infarct, acute hemorrhage or mass effect. Early confluent hyperintense T2-weighted signal of the periventricular and deep white matter, most commonly due to chronic ischemic microangiopathy. Generalized atrophy without lobar predilection. VASCULAR: Major intracranial arterial and venous sinus flow voids are preserved. SKULL AND UPPER CERVICAL SPINE: The visualized skull base, calvarium, upper cervical spine and extracranial soft tissues are  normal. SINUSES/ORBITS: No fluid levels or advanced mucosal thickening. No mastoid or middle ear effusion. The orbits are normal. IMPRESSION: 1. Severely motion degraded examination. 2. Within that limitation, no acute intracranial abnormality. 3. Chronic ischemic microangiopathy and generalized atrophy. Electronically Signed   By: Deatra Robinson M.D.   On: 11/17/2017 14:33    Microbiology: Recent Results (from the past 240 hour(s))  Urine culture     Status: None   Collection Time: 11/16/17  2:05 PM  Result Value Ref Range Status   Specimen Description   Final    URINE, CATHETERIZED Performed at [facility], [address] [place], [place] [address]    Special Requests   Final    NONE Performed at [facility], [address], [place], [place] [address]    Culture   Final    NO GROWTH Performed at [facility], [address] [place], [place] [address]    Report Status 11/18/2017 FINAL  Final  MRSA PCR Screening     Status: None   Collection Time: 11/16/17  5:09 PM  Result Value Ref Range Status   MRSA by PCR NEGATIVE NEGATIVE Final    Comment:        The GeneXpert MRSA Assay (FDA approved for NASAL specimens only), is one component of a comprehensive MRSA colonization surveillance program. It is not intended to diagnose MRSA infection  nor to guide or monitor treatment for MRSA infections. Performed at [facility], [address], [place], [place] [address]      Labs: Basic Metabolic Panel: Recent Labs  Lab 11/16/17 1301 11/17/17 0413 11/18/17 0211 11/19/17 0507 11/20/17 0408 11/21/17 0539  NA 140 142 142 144 144 141  K 3.4* 3.9 3.5 3.2* 3.3* 3.5  CL 110 114* 113* 116* 117* 116*  CO2 21* 22 21* 25 23 24   GLUCOSE 105* 98 85 100* 88 79  BUN 13 12 10 11 12 13   CREATININE 0.87 0.80 0.78 0.74 0.68 0.77  CALCIUM 8.0* 7.7* 7.7* 7.5* 7.3* 7.1*  MG 1.9  --   --   --   --   --    Liver Function Tests: Recent Labs  Lab 11/16/17 1301 11/17/17 0413  AST 41 35   ALT 18 15  ALKPHOS 67 46  BILITOT 2.7* 2.6*  PROT 6.5 5.2*  ALBUMIN 2.7* 2.2*   No results for input(s): LIPASE, AMYLASE in the last 168 hours. Recent Labs  Lab 11/16/17 1301 11/17/17 0413 11/18/17 0211 11/20/17 1302  AMMONIA 55* 48* 34 38*   CBC: Recent Labs  Lab 11/16/17 1301 11/17/17 0413  11/18/17 1224 11/18/17 2037 11/19/17 0507 11/20/17 1528 11/21/17 0539  WBC 10.6* 4.6  --   --   --  4.5 7.3 6.5  NEUTROABS 6.8  --   --   --   --   --   --   --   HGB 8.5* 7.7*   < > 9.5* 9.1* 9.1* 10.2* 9.1*  HCT 27.3* 24.8*   < > 30.0* 28.1* 28.5* 32.6* 28.9*  MCV 89.2 89.9  --   --   --  90.8 92.6 92.9  PLT 138* 76*  --   --   --  73* 96* 80*   < > = values in this interval not displayed.   Cardiac Enzymes: Recent Labs  Lab 11/16/17 1301  TROPONINI <0.03   BNP: BNP (last 3 results) No results for input(s): BNP in the last 8760 hours.  ProBNP (last 3 results) No results for input(s): PROBNP in the last 8760 hours.  CBG: Recent Labs  Lab 11/16/17 1255 11/16/17 1337  GLUCAP 100* 90       Signed:  Lisandro Meggett M   Pager: [phone] 11/21/2017, 9:30 AM

## 2017-12-06 ENCOUNTER — Emergency Department (HOSPITAL_COMMUNITY): Payer: Medicare Other

## 2017-12-06 ENCOUNTER — Other Ambulatory Visit: Payer: Self-pay

## 2017-12-06 ENCOUNTER — Encounter (HOSPITAL_COMMUNITY): Payer: Self-pay | Admitting: Emergency Medicine

## 2017-12-06 ENCOUNTER — Inpatient Hospital Stay (HOSPITAL_COMMUNITY)
Admission: EM | Admit: 2017-12-06 | Discharge: 2017-12-10 | DRG: 441 | Disposition: A | Discharge status: Hospice | Payer: Medicare Other | Attending: Family Medicine | Admitting: Family Medicine

## 2017-12-06 DIAGNOSIS — K7290 Hepatic failure, unspecified without coma: Principal | ICD-10-CM | POA: Diagnosis present

## 2017-12-06 DIAGNOSIS — K7030 Alcoholic cirrhosis of liver without ascites: Secondary | ICD-10-CM | POA: Diagnosis present

## 2017-12-06 DIAGNOSIS — Z79899 Other long term (current) drug therapy: Secondary | ICD-10-CM | POA: Diagnosis not present

## 2017-12-06 DIAGNOSIS — J90 Pleural effusion, not elsewhere classified: Secondary | ICD-10-CM | POA: Diagnosis present

## 2017-12-06 DIAGNOSIS — G9341 Metabolic encephalopathy: Secondary | ICD-10-CM | POA: Diagnosis present

## 2017-12-06 DIAGNOSIS — K766 Portal hypertension: Secondary | ICD-10-CM | POA: Diagnosis present

## 2017-12-06 DIAGNOSIS — Z6822 Body mass index (BMI) 22.0-22.9, adult: Secondary | ICD-10-CM | POA: Diagnosis not present

## 2017-12-06 DIAGNOSIS — D696 Thrombocytopenia, unspecified: Secondary | ICD-10-CM

## 2017-12-06 DIAGNOSIS — K7031 Alcoholic cirrhosis of liver with ascites: Secondary | ICD-10-CM

## 2017-12-06 DIAGNOSIS — E43 Unspecified severe protein-calorie malnutrition: Secondary | ICD-10-CM | POA: Diagnosis not present

## 2017-12-06 DIAGNOSIS — F1020 Alcohol dependence, uncomplicated: Secondary | ICD-10-CM | POA: Diagnosis present

## 2017-12-06 DIAGNOSIS — D689 Coagulation defect, unspecified: Secondary | ICD-10-CM | POA: Diagnosis present

## 2017-12-06 DIAGNOSIS — R410 Disorientation, unspecified: Secondary | ICD-10-CM | POA: Diagnosis present

## 2017-12-06 DIAGNOSIS — Z66 Do not resuscitate: Secondary | ICD-10-CM | POA: Diagnosis present

## 2017-12-06 DIAGNOSIS — Z7189 Other specified counseling: Secondary | ICD-10-CM | POA: Diagnosis not present

## 2017-12-06 DIAGNOSIS — G9340 Encephalopathy, unspecified: Secondary | ICD-10-CM | POA: Diagnosis not present

## 2017-12-06 DIAGNOSIS — E872 Acidosis: Secondary | ICD-10-CM | POA: Diagnosis present

## 2017-12-06 DIAGNOSIS — K3189 Other diseases of stomach and duodenum: Secondary | ICD-10-CM | POA: Diagnosis present

## 2017-12-06 DIAGNOSIS — Z515 Encounter for palliative care: Secondary | ICD-10-CM | POA: Diagnosis not present

## 2017-12-06 LAB — URINALYSIS, ROUTINE W REFLEX MICROSCOPIC
BACTERIA UA: NONE SEEN
Bilirubin Urine: NEGATIVE
GLUCOSE, UA: NEGATIVE mg/dL
Ketones, ur: NEGATIVE mg/dL
Leukocytes, UA: NEGATIVE
Nitrite: NEGATIVE
PROTEIN: NEGATIVE mg/dL
Specific Gravity, Urine: 1.019 (ref 1.005–1.030)
pH: 7 (ref 5.0–8.0)

## 2017-12-06 LAB — BLOOD GAS, ARTERIAL
ACID-BASE DEFICIT: 0.7 mmol/L (ref 0.0–2.0)
BICARBONATE: 24.1 mmol/L (ref 20.0–28.0)
Drawn by: 419771
FIO2: 21
O2 Saturation: 97.2 %
PH ART: 7.454 — AB (ref 7.350–7.450)
pCO2 arterial: 32.9 mmHg (ref 32.0–48.0)
pO2, Arterial: 95.9 mmHg (ref 83.0–108.0)

## 2017-12-06 LAB — CBC
HCT: 41.8 % (ref 39.0–52.0)
Hemoglobin: 13.3 g/dL (ref 13.0–17.0)
MCH: 30 pg (ref 26.0–34.0)
MCHC: 31.8 g/dL (ref 30.0–36.0)
MCV: 94.4 fL (ref 78.0–100.0)
PLATELETS: 138 10*3/uL — AB (ref 150–400)
RBC: 4.43 MIL/uL (ref 4.22–5.81)
RDW: 24 % — AB (ref 11.5–15.5)
WBC: 8.3 10*3/uL (ref 4.0–10.5)

## 2017-12-06 LAB — COMPREHENSIVE METABOLIC PANEL
ALT: 21 U/L (ref 0–44)
AST: 46 U/L — AB (ref 15–41)
Albumin: 3.1 g/dL — ABNORMAL LOW (ref 3.5–5.0)
Alkaline Phosphatase: 86 U/L (ref 38–126)
Anion gap: 7 (ref 5–15)
BILIRUBIN TOTAL: 2.9 mg/dL — AB (ref 0.3–1.2)
BUN: 20 mg/dL (ref 8–23)
CHLORIDE: 110 mmol/L (ref 98–111)
CO2: 24 mmol/L (ref 22–32)
CREATININE: 0.74 mg/dL (ref 0.61–1.24)
Calcium: 8.8 mg/dL — ABNORMAL LOW (ref 8.9–10.3)
Glucose, Bld: 102 mg/dL — ABNORMAL HIGH (ref 70–99)
Potassium: 4.4 mmol/L (ref 3.5–5.1)
Sodium: 141 mmol/L (ref 135–145)
TOTAL PROTEIN: 7.9 g/dL (ref 6.5–8.1)

## 2017-12-06 LAB — LACTIC ACID, PLASMA: Lactic Acid, Venous: 1.5 mmol/L (ref 0.5–1.9)

## 2017-12-06 LAB — RAPID URINE DRUG SCREEN, HOSP PERFORMED
Amphetamines: NOT DETECTED
BARBITURATES: NOT DETECTED
Benzodiazepines: NOT DETECTED
COCAINE: NOT DETECTED
Opiates: NOT DETECTED
Tetrahydrocannabinol: NOT DETECTED

## 2017-12-06 LAB — AMMONIA
AMMONIA: 33 umol/L (ref 9–35)
Ammonia: 23 umol/L (ref 9–35)

## 2017-12-06 LAB — I-STAT CG4 LACTIC ACID, ED
LACTIC ACID, VENOUS: 0.97 mmol/L (ref 0.5–1.9)
Lactic Acid, Venous: 2.58 mmol/L (ref 0.5–1.9)

## 2017-12-06 LAB — LIPASE, BLOOD: Lipase: 66 U/L — ABNORMAL HIGH (ref 11–51)

## 2017-12-06 LAB — CBG MONITORING, ED: Glucose-Capillary: 85 mg/dL (ref 70–99)

## 2017-12-06 LAB — T4, FREE: Free T4: 1.12 ng/dL (ref 0.82–1.77)

## 2017-12-06 LAB — TSH: TSH: 3.025 u[IU]/mL (ref 0.350–4.500)

## 2017-12-06 MED ORDER — ALBUTEROL SULFATE (2.5 MG/3ML) 0.083% IN NEBU
INHALATION_SOLUTION | RESPIRATORY_TRACT | Status: AC
  Administered 2017-12-06: 2.5 mg
  Filled 2017-12-06: qty 6

## 2017-12-06 MED ORDER — SODIUM CHLORIDE 0.9 % IV BOLUS
1000.0000 mL | Freq: Once | INTRAVENOUS | Status: AC
  Administered 2017-12-06: 1000 mL via INTRAVENOUS

## 2017-12-06 MED ORDER — SODIUM CHLORIDE 0.9 % IV BOLUS
500.0000 mL | Freq: Once | INTRAVENOUS | Status: AC
  Administered 2017-12-06: 500 mL via INTRAVENOUS

## 2017-12-06 MED ORDER — VANCOMYCIN HCL 500 MG IV SOLR
500.0000 mg | Freq: Two times a day (BID) | INTRAVENOUS | Status: DC
  Administered 2017-12-06 – 2017-12-09 (×6): 500 mg via INTRAVENOUS
  Filled 2017-12-06 (×8): qty 500

## 2017-12-06 MED ORDER — VANCOMYCIN HCL IN DEXTROSE 1-5 GM/200ML-% IV SOLN
1000.0000 mg | Freq: Once | INTRAVENOUS | Status: AC
  Administered 2017-12-06: 1000 mg via INTRAVENOUS
  Filled 2017-12-06: qty 200

## 2017-12-06 MED ORDER — ADULT MULTIVITAMIN W/MINERALS CH
1.0000 | ORAL_TABLET | Freq: Every day | ORAL | Status: DC
  Administered 2017-12-06 – 2017-12-10 (×5): 1 via ORAL
  Filled 2017-12-06 (×5): qty 1

## 2017-12-06 MED ORDER — ALBUTEROL SULFATE (2.5 MG/3ML) 0.083% IN NEBU
2.5000 mg | INHALATION_SOLUTION | RESPIRATORY_TRACT | Status: DC | PRN
  Administered 2017-12-07 – 2017-12-09 (×5): 2.5 mg via RESPIRATORY_TRACT
  Filled 2017-12-06 (×5): qty 3

## 2017-12-06 MED ORDER — PROPRANOLOL HCL 20 MG PO TABS
10.0000 mg | ORAL_TABLET | Freq: Two times a day (BID) | ORAL | Status: DC
  Administered 2017-12-06 – 2017-12-09 (×6): 10 mg via ORAL
  Administered 2017-12-09: 21:00:00 via ORAL
  Administered 2017-12-10: 10 mg via ORAL
  Filled 2017-12-06 (×9): qty 1

## 2017-12-06 MED ORDER — ACETAMINOPHEN 325 MG PO TABS: 650.0000 mg | ORAL_TABLET | Freq: Four times a day (QID) | ORAL | Status: DC | PRN

## 2017-12-06 MED ORDER — FOLIC ACID 1 MG PO TABS
1.0000 mg | ORAL_TABLET | Freq: Every day | ORAL | Status: DC
  Administered 2017-12-06 – 2017-12-10 (×5): 1 mg via ORAL
  Filled 2017-12-06 (×5): qty 1

## 2017-12-06 MED ORDER — LACTULOSE 10 GM/15ML PO SOLN
20.0000 g | Freq: Two times a day (BID) | ORAL | Status: DC
  Administered 2017-12-06 – 2017-12-09 (×7): 20 g via ORAL
  Filled 2017-12-06 (×9): qty 30

## 2017-12-06 MED ORDER — ONDANSETRON HCL 4 MG PO TABS: 4.0000 mg | ORAL_TABLET | Freq: Four times a day (QID) | ORAL | Status: DC | PRN

## 2017-12-06 MED ORDER — PANTOPRAZOLE SODIUM 40 MG PO TBEC
40.0000 mg | DELAYED_RELEASE_TABLET | Freq: Two times a day (BID) | ORAL | Status: DC
  Administered 2017-12-06 – 2017-12-10 (×8): 40 mg via ORAL
  Filled 2017-12-06 (×9): qty 1

## 2017-12-06 MED ORDER — ACETAMINOPHEN 650 MG RE SUPP: 650.0000 mg | Freq: Four times a day (QID) | RECTAL | Status: DC | PRN

## 2017-12-06 MED ORDER — SODIUM CHLORIDE 0.9 % IV SOLN
1.0000 g | Freq: Two times a day (BID) | INTRAVENOUS | Status: DC
  Administered 2017-12-06 – 2017-12-10 (×8): 1 g via INTRAVENOUS
  Filled 2017-12-06 (×10): qty 1

## 2017-12-06 MED ORDER — SODIUM CHLORIDE 0.9 % IV SOLN
INTRAVENOUS | Status: DC
  Administered 2017-12-07 – 2017-12-09 (×3): via INTRAVENOUS

## 2017-12-06 MED ORDER — ONDANSETRON HCL 4 MG/2ML IJ SOLN: 4.0000 mg | Freq: Four times a day (QID) | INTRAMUSCULAR | Status: DC | PRN

## 2017-12-06 MED ORDER — SODIUM CHLORIDE 0.9 % IV SOLN
INTRAVENOUS | Status: AC
  Administered 2017-12-06 – 2017-12-07 (×2): via INTRAVENOUS

## 2017-12-06 MED ORDER — VITAMIN D 1000 UNITS PO TABS
1000.0000 [IU] | ORAL_TABLET | Freq: Every day | ORAL | Status: DC
  Administered 2017-12-06 – 2017-12-10 (×5): 1000 [IU] via ORAL
  Filled 2017-12-06 (×7): qty 1

## 2017-12-06 MED ORDER — SODIUM CHLORIDE 0.9 % IV SOLN
1.0000 g | Freq: Once | INTRAVENOUS | Status: AC
  Administered 2017-12-06: 1 g via INTRAVENOUS
  Filled 2017-12-06: qty 1

## 2017-12-06 MED ORDER — PRO-STAT SUGAR FREE PO LIQD
30.0000 mL | Freq: Three times a day (TID) | ORAL | Status: DC
  Administered 2017-12-06 – 2017-12-10 (×11): 30 mL via ORAL
  Filled 2017-12-06 (×12): qty 30

## 2017-12-06 NOTE — ED Notes (Signed)
CRITICAL VALUE ALERT  Critical Value: POC  Lactic Acid 2.58 Date & Time Notied:  12/06/17 Provider Notified: Rancour Orders Received/Actions taken: None yet

## 2017-12-06 NOTE — Progress Notes (Addendum)
ANTIBIOTIC CONSULT NOTE-Preliminary  Pharmacy Consult for vancomycin Indication: pneumonia  No Known Allergies  Patient Measurements: Height: 5\' 7"  (170.2 cm) Weight: 142 lb 3.2 oz (64.5 kg) IBW/kg (Calculated) : 66.1 Adjusted Body Weight:   Vital Signs: Temp: 97.2 F (36.2 C) (08/18 0535) Temp Source: Rectal (08/18 0535) BP: 132/102 (08/18 0530) Pulse Rate: 57 (08/18 0530)  Labs: Recent Labs    12/06/17 0502  WBC 8.3  HGB 13.3  PLT 138*  CREATININE 0.74    Estimated Creatinine Clearance: 68.3 mL/min (by C-G formula based on SCr of 0.74 mg/dL).  No results for input(s): VANCOTROUGH, VANCOPEAK, VANCORANDOM, GENTTROUGH, GENTPEAK, GENTRANDOM, TOBRATROUGH, TOBRAPEAK, TOBRARND, AMIKACINPEAK, AMIKACINTROU, AMIKACIN in the last 72 hours.   Microbiology: Recent Results (from the past 720 hour(s))  Urine culture     Status: None   Collection Time: 11/16/17  2:05 PM  Result Value Ref Range Status   Specimen Description   Final    URINE, CATHETERIZED Performed at [facility], [address] [place], [place] [address]    Special Requests   Final    NONE Performed at [facility], [address] [place], [place] [address]    Culture   Final    NO GROWTH Performed at [facility], [address] [place], [place] [address]    Report Status 11/18/2017 FINAL  Final  MRSA PCR Screening     Status: None   Collection Time: 11/16/17  5:09 PM  Result Value Ref Range Status   MRSA by PCR NEGATIVE NEGATIVE Final    Comment:        The GeneXpert MRSA Assay (FDA approved for NASAL specimens only), is one component of a comprehensive MRSA colonization surveillance program. It is not intended to diagnose MRSA infection nor to guide or monitor treatment for MRSA infections. Performed at [facility], [address] [place], [place] [address]     Medical History: Past Medical History:  Diagnosis Date  . Anemia   . Cirrhosis with alcoholism (HCC)   . Elevated  bilirubin   . Hepatic encephalopathy (HCC)   . Thrombocytopenia (HCC)     Medications:  Scheduled:   Infusions:  . sodium chloride    . vancomycin     Anti-infectives (From admission, onward)   Start     Dose/Rate Route Frequency Ordered Stop   12/06/17 0645  vancomycin (VANCOCIN) IVPB 1000 mg/200 mL premix     1,000 mg 200 mL/hr over 60 Minutes Intravenous  Once 12/06/17 0630     12/06/17 0630  ceFEPIme (MAXIPIME) 1 g in sodium chloride 0.9 % 100 mL IVPB     1 g 200 mL/hr over 30 Minutes Intravenous  Once 12/06/17 0618 12/06/17 0715      Assessment: 77 yo male admitted with AMS.  Starting vancomycin for PNA.    Goal of Therapy:  Vancomycin trough level 15-20 mcg/ml  Plan:  Preliminary review of pertinent patient information completed.  Protocol will be initiated with dose(s) of vancomycin 1 gram.  [facility] clinical pharmacist will complete review during morning rounds to assess patient and finalize treatment regimen if needed.  Loyola MastMidkiff, Marlyn Tondreau Scarlett, RPH 12/06/2017,7:33 AM

## 2017-12-06 NOTE — ED Triage Notes (Signed)
Pt brought in by family d/t increased confusion. Family thinks ammonia level is up. Family gave pt lactulose at 0400.

## 2017-12-06 NOTE — ED Provider Notes (Signed)
[facility] EMERGENCY DEPARTMENT Provider Note    CSN: [id number] Arrival date & time: 12/06/17  0435     History   Chief Complaint Chief Complaint  Patient presents with  . Altered Mental Status    HPI Madelynn DoneJoseph G Fowles Jr. is a 77 y.o. male.  Patient from home with altered mental status since approximately 10 PM last night.  Family concerned about elevated ammonia levels.  Patient with history of alcoholic cirrhosis and hepatic encephalopathy.  Family states he has been compliant with his lactulose.  He did receive an extra dose of 400 am this morning.  He said chills but no fever.  No falls or trauma.  Patient has been confused and talking out of his head and not making sense which she has done in the past with his elevated ammonia levels.  Recent admission for hepatic encephalopathy and GI bleed with banding of esophageal varices.  The report no current blood in his stools.  Level 5 caveat for altered mental status Patient oriented to person only and denies pain Chronic diarrhea at baseline. No cough or fever. No urinary symptoms.   The history is provided by the patient and a caregiver.  Altered Mental Status      Past Medical History:  Diagnosis Date  . Anemia   . Cirrhosis with alcoholism (HCC)   . Elevated bilirubin   . Hepatic encephalopathy (HCC)   . Thrombocytopenia [facility](HCC)     Patient Active Problem List   Diagnosis Date Noted  . Protein-calorie malnutrition, severe 11/18/2017  . Goals of care, counseling/discussion   . Palliative care by specialist   . Encounter for hospice care discussion   . GIB (gastrointestinal bleeding) 11/16/2017  . Acute hepatic encephalopathy   . Normocytic anemia due to blood loss   . Alcoholic cirrhosis (HCC) 09/21/2012  . Ascites 09/06/2012  . Elevated bilirubin 09/06/2012  . Thrombocytopenia, unspecified (HCC) 09/06/2012  . Fracture of ankle, trimalleolar, right, closed 06/09/2012    Past Surgical History:  Procedure  Laterality Date  . ESOPHAGEAL BANDING  11/17/2017   Procedure: ESOPHAGEAL BANDING;  Surgeon: Malissa Hippoehman, Najeeb U, MD;  Location: [facility];  Service: Endoscopy;;  . ESOPHAGOGASTRODUODENOSCOPY (EGD) WITH PROPOFOL N/A 11/17/2017   Procedure: ESOPHAGOGASTRODUODENOSCOPY (EGD) WITH PROPOFOL;  Surgeon: Malissa Hippoehman, Najeeb U, MD;  Location: [facility];  Service: Endoscopy;  Laterality: N/A;  . neck fx     2005  . ORIF ANKLE FRACTURE Right 06/09/2012   Procedure: OPEN REDUCTION INTERNAL FIXATION (ORIF) ANKLE FRACTURE;  Surgeon: Vickki HearingStanley E Harrison, MD;  Location: [facility] ORS;  Service: Orthopedics;  Laterality: Right;        Home Medications    Prior to Admission medications   Medication Sig Start Date End Date Taking? Authorizing Provider  albuterol (PROVENTIL HFA;VENTOLIN HFA) 108 (90 Base) MCG/ACT inhaler Inhale 2 puffs into the lungs 4 (four) times daily. 09/07/17  Yes [provider]  Amino Acids-Protein Hydrolys (FEEDING SUPPLEMENT, PRO-STAT SUGAR FREE 64,) LIQD Take 30 mLs by mouth 3 (three) times daily. 11/21/17  Yes Dondiego, [name], MD  cholecalciferol (VITAMIN D) 1000 units tablet Take 1,000 Units by mouth daily.    Yes [provider]  folic acid (FOLVITE) 1 MG tablet Take 1 tablet (1 mg total) by mouth daily. 11/22/17  Yes [name], Richard, MD  lactulose (CHRONULAC) 10 GM/15ML solution Take 30 mLs (20 g total) by mouth 2 (two) times daily. 11/21/17  Yes [name], Richard, MD  Multiple Vitamin (MULTIVITAMIN WITH MINERALS) TABS  tablet Take 1 tablet by mouth daily. 11/22/17  Yes Oval Linsey, MD  pantoprazole (PROTONIX) 40 MG tablet Take 1 tablet (40 mg total) by mouth 2 (two) times daily. 11/21/17  Yes Dondiego, Richard, MD  potassium chloride SA (K-DUR,KLOR-CON) 20 MEQ tablet Take 1 tablet (20 mEq total) by mouth daily. 11/21/17  Yes Oval Linsey, MD  propranolol (INDERAL) 10 MG tablet Take 1 tablet (10 mg total) by mouth 2 (two) times daily. 11/21/17  Yes Oval Linsey, MD     Family History No family history on file.  Social History Social History   Tobacco Use  . Smoking status: Never Smoker  . Smokeless tobacco: Never Used  Substance Use Topics  . Alcohol use: No    Alcohol/week: 0.0 standard drinks    Comment: NO etoh since February, 2014  . Drug use: No     Allergies   Patient has no known allergies.   Review of Systems Review of Systems  Unable to perform ROS: Mental status change     Physical Exam Updated Vital Signs BP (!) 150/90 (BP Location: Left Arm)   Pulse (!) 55   Temp (!) 97.5 F (36.4 C) (Oral)   Resp 15   Ht 5\' 7"  (1.702 m)   Wt 64.5 kg   SpO2 100%   BMI 22.27 kg/m   Physical Exam  Constitutional: He appears well-developed and well-nourished. No distress.  HENT:  Head: Normocephalic and atraumatic.  Mouth/Throat: Oropharynx is clear and moist. No oropharyngeal exudate.  Scleral icterus and jaundice present  Eyes: Pupils are equal, round, and reactive to light. Conjunctivae and EOM are normal. Scleral icterus is present.  Neck: Normal range of motion. Neck supple.  No meningismus.  Cardiovascular: Normal rate, regular rhythm, normal heart sounds and intact distal pulses. Exam reveals no gallop.  No murmur heard. Pulmonary/Chest: Effort normal and breath sounds normal. No respiratory distress. He exhibits no tenderness.  Abdominal: Soft. There is no tenderness. There is no rebound and no guarding.  No fluid wave  Musculoskeletal: Normal range of motion. He exhibits no edema or tenderness.  Neurological: He is alert. No cranial nerve deficit. He exhibits normal muscle tone. Coordination normal.  Oriented x1, moves all extremities equally, follows commands, no appreciable asterixis  Skin: Skin is warm. Capillary refill takes less than 2 seconds. No rash noted. No pallor.  Psychiatric: He has a normal mood and affect. His behavior is normal.  Nursing note and vitals reviewed.    ED Treatments / Results   Labs (all labs ordered are listed, but only abnormal results are displayed) Labs Reviewed  COMPREHENSIVE METABOLIC PANEL - Abnormal; Notable for the following components:      Result Value   Glucose, Bld 102 (*)    Calcium 8.8 (*)    Albumin 3.1 (*)    AST 46 (*)    Total Bilirubin 2.9 (*)    All other components within normal limits  CBC - Abnormal; Notable for the following components:   RDW 24.0 (*)    Platelets 138 (*)    All other components within normal limits  URINALYSIS, ROUTINE W REFLEX MICROSCOPIC - Abnormal; Notable for the following components:   Hgb urine dipstick SMALL (*)    All other components within normal limits  LIPASE, BLOOD - Abnormal; Notable for the following components:   Lipase 66 (*)    All other components within normal limits  I-STAT CG4 LACTIC ACID, ED - Abnormal; Notable for the following components:  Lactic Acid, Venous 2.58 (*)    All other components within normal limits  CULTURE, BLOOD (ROUTINE X 2)  CULTURE, BLOOD (ROUTINE X 2)  URINE CULTURE  AMMONIA  CBG MONITORING, ED  I-STAT CG4 LACTIC ACID, ED    EKG None  Radiology Dg Chest 2 View  Result Date: 12/06/2017 CLINICAL DATA:  Altered mental status. EXAM: CHEST - 2 VIEW COMPARISON:  11/16/2017 FINDINGS: Moderate bilateral pleural effusions with basilar atelectasis in the lungs. Heart size is obscured by the parenchymal process but appears enlarged. Pleural effusions have increased since previous study. No pneumothorax. Mediastinal contours appear intact. IMPRESSION: Increasing bilateral pleural effusions and basilar atelectasis. Cardiac enlargement. Electronically Signed   By: Burman NievesWilliam  Stevens M.D.   On: 12/06/2017 06:20   Ct Head Wo Contrast  Result Date: 12/06/2017 CLINICAL DATA:  Initial evaluation for acute altered mental status. EXAM: CT HEAD WITHOUT CONTRAST TECHNIQUE: Contiguous axial images were obtained from the base of the skull through the vertex without intravenous  contrast. COMPARISON:  Prior MRI from 11/17/2017. FINDINGS: Brain: Examination mildly degraded by motion artifact. Moderate atrophy with chronic small vessel ischemic disease. No acute intracranial hemorrhage. No acute large vessel territory infarct. No mass lesion, midline shift or mass effect. No hydrocephalus. No extra-axial fluid collection. Vascular: No hyperdense vessel. Skull: Scalp soft tissues demonstrate no acute abnormality. Calvarium intact. Sinuses/Orbits: Globes and orbital soft tissues within normal limits. Punctate metallic density noted anterior to the left globe. Visualized paranasal sinuses are clear. Mastoid air cells are clear. Other: None. IMPRESSION: 1. No acute intracranial abnormality. 2. Moderate cerebral atrophy with chronic small vessel ischemic disease. Electronically Signed   By: Rise MuBenjamin  McClintock M.D.   On: 12/06/2017 06:21    Procedures Procedures (including critical care time)  Medications Ordered in ED Medications  0.9 %  sodium chloride infusion (has no administration in time range)  sodium chloride 0.9 % bolus 500 mL (0 mLs Intravenous Stopped 12/06/17 0715)  ceFEPIme (MAXIPIME) 1 g in sodium chloride 0.9 % 100 mL IVPB (0 g Intravenous Stopped 12/06/17 0715)  sodium chloride 0.9 % bolus 1,000 mL (0 mLs Intravenous Stopped 12/06/17 0843)  vancomycin (VANCOCIN) IVPB 1000 mg/200 mL premix (0 mg Intravenous Stopped 12/06/17 0810)     Initial Impression / Assessment and Plan / ED Course  I have reviewed the triage vital signs and the nursing notes.  Pertinent labs & imaging results that were available during my care of the patient were reviewed by me and considered in my medical decision making (see chart for details).    Patient with history of alcoholic cirrhosis presenting with altered mental status and concern for hyperammonemia.  He said chills but no fever.  Lactic acid 2.5. Afebrile. Hydration started. Septic workup begun. Ammonia 33 so hepatic  encephalopathy seems less likely. CT head stable. UA negative.  CXR with worsening pleural effusions, likely from liver disease. No cough or fever. HCAP considered. Hemoconcentration on CBC with FOBT trace positive.  Broad spectrum antibiotics started. Unclear source of acute encephalopathy at this time. No new meds.NH3 normal.   Plan admission for further workup of AMS. D/w Dr. Sherryll BurgerShah. Family updated.  Final Clinical Impressions(s) / ED Diagnoses   Final diagnoses:  Acute encephalopathy  Pleural effusion    ED Discharge Orders    None       Lorre Opdahl, Jeannett SeniorStephen, MD 12/06/17 [phone]

## 2017-12-06 NOTE — H&P (Signed)
History and Physical  Bradley DoneJoseph G Sun Jr. ZOX:[id number] DOB: [birth year] DOA: 12/06/2017   PCP: [name], Richard, MD   Patient coming from: Home  Chief Complaint: confusion  HPI:  Bradley DoneJoseph G Arlotta Jr. is a 77 y.o. male with medical history of alcoholic liver cirrhosis with esophageal varices and portal hypertensive gastropathy presenting with altered mental status.  The patient is unable to provide any history at this time secondary to his acute encephalopathy.  According to the patient's wife with whom he lives, the patient had increasing confusion that began on the afternoon of 12/05/2017.  The patient was recently discharged from the hospital after a stay from 11/16/2017 through 11/21/2017.  During the stay, the patient was treated for hepatic encephalopathy and GI bleed from esophageal varices.  EGD on 11/17/2017 showed grade 2 and 3 esophageal varices that were banded.  There was also portal hypertensive gastropathy.  MRI of the brain during that admission on 11/17/2017 was negative for any acute findings.  The patient has not been started any new medications since discharge from the hospital.  He does not take any over-the-counter medicines except for occasional acetaminophen.  He has not been drinking any alcohol.  There is been no reports of chest pain, shortness breath, vomiting, hematochezia, melena, headaches.  He has chronic loose stools from lactulose.  His wife states that he has had 2-3 loose bowel movements daily.  In the emergency department, the patient was afebrile hemodynamically stable saturating 100% on room air.  BMP and LFTs were essentially unremarkable except for total bilirubin of 2.9.  CBC was unremarkable except for platelets of 130,000.  Lactic acid was 2.58.  Ammonia was 33.  The patient was started on empiric vancomycin and cefepime pending culture data.  Assessment/Plan: Acute metabolic encephalopathy -Etiology unclear presently, work-up in progress -Ammonia  33 -Obtain UA/urine culture -Check ABG -11/16/2017 serum B12 2646 -Check TSH -12/06/2017 CT brain negative -Continue home dose lactulose -Suspect that there is a degree of volume depletion which may be contributing to the patient's encephalopathy as the patient is hemoconcentrated with slight increase of his BUN (which may also suggest possible upper GI bleed)  Lactic acidosis -Blood cultures x2 sets -UA and urine culture -Empiric vancomycin and cefepime pending culture data -Trend lactic acid -IV fluids  Bilateral pleural effusions -pt is stable on RA-->100% -likely due to hepatothorax from liver cirrhosis  Coagulopathy -Secondary to liver cirrhosis -11/21/2017 INR 2.51 -Monitor for signs of bleeding  Alcoholic liver cirrhosis with portal HTN -continue propranolol  Severe malnutrition -continue prostat     Past Medical History:  Diagnosis Date  . Anemia   . Cirrhosis with alcoholism (HCC)   . Elevated bilirubin   . Hepatic encephalopathy (HCC)   . Thrombocytopenia (HCC)    Past Surgical History:  Procedure Laterality Date  . ESOPHAGEAL BANDING  11/17/2017   Procedure: ESOPHAGEAL BANDING;  Surgeon: Malissa Hippoehman, Najeeb U, MD;  Location[facility];  Service: Endoscopy;;  . ESOPHAGOGASTRODUODENOSCOPY (EGD) WITH PROPOFOL N/A 11/17/2017   Procedure: ESOPHAGOGASTRODUODENOSCOPY (EGD) WITH PROPOFOL;  Surgeon: Malissa Hippoehman, Najeeb U, MD;  Location: [facility];  Service: Endoscopy;  Laterality: N/A;  . neck fx     2005  . ORIF ANKLE FRACTURE Right 06/09/2012   Procedure: OPEN REDUCTION INTERNAL FIXATION (ORIF) ANKLE FRACTURE;  Surgeon: Vickki HearingStanley E Harrison, MD;  Location: [facility];  Service: Orthopedics;  Laterality: Right;   Social History:  reports that he has never smoked. He has never used  smokeless tobacco. He reports that he does not drink alcohol or use drugs.   FAmily History--unobtainable due to pt's acute encephalopathy  No Known Allergies   Prior to Admission  medications   Medication Sig Start Date End Date Taking? Authorizing Provider  albuterol (PROVENTIL HFA;VENTOLIN HFA) 108 (90 Base) MCG/ACT inhaler Inhale 2 puffs into the lungs 4 (four) times daily. 09/07/17  Yes [provider]  Amino Acids-Protein Hydrolys (FEEDING SUPPLEMENT, PRO-STAT SUGAR FREE 64,) LIQD Take 30 mLs by mouth 3 (three) times daily. 11/21/17  Yes Dondiego, [name], MD  cholecalciferol (VITAMIN D) 1000 units tablet Take 1,000 Units by mouth daily.    Yes [provider]  folic acid (FOLVITE) 1 MG tablet Take 1 tablet (1 mg total) by mouth daily. 11/22/17  Yes [name], Richard, MD  lactulose (CHRONULAC) 10 GM/15ML solution Take 30 mLs (20 g total) by mouth 2 (two) times daily. 11/21/17  Yes Dondiego, [name], MD  Multiple Vitamin (MULTIVITAMIN WITH MINERALS) TABS tablet Take 1 tablet by mouth daily. 11/22/17  Yes [name], Richard, MD  pantoprazole (PROTONIX) 40 MG tablet Take 1 tablet (40 mg total) by mouth 2 (two) times daily. 11/21/17  Yes Dondiego, Richard, MD  potassium chloride SA (K-DUR,KLOR-CON) 20 MEQ tablet Take 1 tablet (20 mEq total) by mouth daily. 11/21/17  Yes [name], Richard, MD  propranolol (INDERAL) 10 MG tablet Take 1 tablet (10 mg total) by mouth 2 (two) times daily. 11/21/17  Yes [name], Richard, MD    Review of Systems:  Unobtainable due to pt's acute encephalopathy  Physical Exam: Vitals:   12/06/17 0455 12/06/17 0500 12/06/17 0530 12/06/17 0535  BP: (!) 150/90 (!) 155/86 (!) 132/102   Pulse: (!) 55 (!) 54 (!) 57   Resp: 15 17 (!) 29   Temp: (!) 97.5 F (36.4 C)   (!) 97.2 F (36.2 C)  TempSrc: Oral   Rectal  SpO2: 100% 100% 93%   Weight:      Height:       General:  A&O x 1, NAD, nontoxic, pleasant/cooperative Head/Eye: No conjunctival hemorrhage, no icterus, [place]/AT, No nystagmus ENT:  No icterus,  No thrush, good dentition, no pharyngeal exudate Neck:  No masses, no lymphadenpathy, no bruits CV:  RRR, no rub, no gallop, no S3 Lung:   CTAB, good air movement, no wheeze, no rhonchi Abdomen: soft/NT, +BS, nondistended, no peritoneal signs Ext: No cyanosis, No rashes, No petechiae, No lymphangitis, No edema Neuro: CNII-XII intact, strength 4/5 in bilateral upper and lower extremities, no dysmetria  Labs on Admission:  Basic Metabolic Panel: Recent Labs  Lab 12/06/17 0502  NA 141  K 4.4  CL 110  CO2 24  GLUCOSE 102*  BUN 20  CREATININE 0.74  CALCIUM 8.8*   Liver Function Tests: Recent Labs  Lab 12/06/17 0502  AST 46*  ALT 21  ALKPHOS 86  BILITOT 2.9*  PROT 7.9  ALBUMIN 3.1*   Recent Labs  Lab 12/06/17 0502  LIPASE 66*   Recent Labs  Lab 12/06/17 0502  AMMONIA 33   CBC: Recent Labs  Lab 12/06/17 0502  WBC 8.3  HGB 13.3  HCT 41.8  MCV 94.4  PLT 138*   Coagulation Profile: No results for input(s): INR, PROTIME in the last 168 hours. Cardiac Enzymes: No results for input(s): CKTOTAL, CKMB, CKMBINDEX, TROPONINI in the last 168 hours. BNP: Invalid input(s): POCBNP CBG: Recent Labs  Lab 12/06/17 0459  GLUCAP 85   Urine analysis:    Component Value Date/Time  COLORURINE YELLOW 12/06/2017 0650   APPEARANCEUR CLEAR 12/06/2017 0650   LABSPEC 1.019 12/06/2017 0650   PHURINE 7.0 12/06/2017 0650   GLUCOSEU NEGATIVE 12/06/2017 0650   HGBUR SMALL (A) 12/06/2017 0650   BILIRUBINUR NEGATIVE 12/06/2017 0650   KETONESUR NEGATIVE 12/06/2017 0650   PROTEINUR NEGATIVE 12/06/2017 0650   UROBILINOGEN 1.0 09/03/2012 1320   NITRITE NEGATIVE 12/06/2017 0650   LEUKOCYTESUR NEGATIVE 12/06/2017 0650   Sepsis Labs: @LABRCNTIP (procalcitonin:4,lacticidven:4) ) Recent Results (from the past 240 hour(s))  Blood culture (routine x 2)     Status: None (Preliminary result)   Collection Time: 12/06/17  5:50 AM  Result Value Ref Range Status   Specimen Description BLOOD  Final   Special Requests NONE  Final   Culture   Final    NO GROWTH <12 HOURS Performed at [facility], [address]  [place], [place] [address]    Report Status PENDING  Incomplete  Blood culture (routine x 2)     Status: None (Preliminary result)   Collection Time: 12/06/17  5:56 AM  Result Value Ref Range Status   Specimen Description BLOOD  Final   Special Requests NONE  Final   Culture   Final    NO GROWTH <12 HOURS Performed at [facility], [address], [place], [place] [address]    Report Status PENDING  Incomplete     Radiological Exams on Admission: Dg Chest 2 View  Result Date: 12/06/2017 CLINICAL DATA:  Altered mental status. EXAM: CHEST - 2 VIEW COMPARISON:  11/16/2017 FINDINGS: Moderate bilateral pleural effusions with basilar atelectasis in the lungs. Heart size is obscured by the parenchymal process but appears enlarged. Pleural effusions have increased since previous study. No pneumothorax. Mediastinal contours appear intact. IMPRESSION: Increasing bilateral pleural effusions and basilar atelectasis. Cardiac enlargement. Electronically Signed   By: Burman Nieves M.D.   On: 12/06/2017 06:20   Ct Head Wo Contrast  Result Date: 12/06/2017 CLINICAL DATA:  Initial evaluation for acute altered mental status. EXAM: CT HEAD WITHOUT CONTRAST TECHNIQUE: Contiguous axial images were obtained from the base of the skull through the vertex without intravenous contrast. COMPARISON:  Prior MRI from 11/17/2017. FINDINGS: Brain: Examination mildly degraded by motion artifact. Moderate atrophy with chronic small vessel ischemic disease. No acute intracranial hemorrhage. No acute large vessel territory infarct. No mass lesion, midline shift or mass effect. No hydrocephalus. No extra-axial fluid collection. Vascular: No hyperdense vessel. Skull: Scalp soft tissues demonstrate no acute abnormality. Calvarium intact. Sinuses/Orbits: Globes and orbital soft tissues within normal limits. Punctate metallic density noted anterior to the left globe. Visualized paranasal sinuses are clear. Mastoid air cells are clear.  Other: None. IMPRESSION: 1. No acute intracranial abnormality. 2. Moderate cerebral atrophy with chronic small vessel ischemic disease. Electronically Signed   By: Rise Mu M.D.   On: 12/06/2017 06:21    EKG: Independently reviewed. pending    Time spent:60 minutes Code Status:   DNR Family Communication:  No Family at bedside Disposition Plan: expect 2-3 day hospitalization Consults called: Palliative DVT Prophylaxis:  SCDs  Catarina Hartshorn, DO  [facility] Pager [phone]  If 7PM-7AM, please contact night-coverage [email or web address] Password [facility] 12/06/2017, 7:47 AM

## 2017-12-06 NOTE — Progress Notes (Signed)
Pharmacy Antibiotic Note  Bradley DoneJoseph G Cadman Jr. is a 77 y.o. male admitted on 12/06/2017 with possible SIRS.  Pharmacy has been consulted for vancomycin and cefepime  dosing.  Plan: Start cefepime 1g IV q12h Loading dose:  vancomycin 1g IV x1 dose Maintenance dose:  vancomycin 500mg  IV q12h  Goal vancomycin trough range:15-20    mcg/mL Pharmacy will continue to monitor renal function, vancomycin troughs as clinically indicated, cultures and patient progress.  Height: 5\' 7"  (170.2 cm) Weight: 145 lb 1 oz (65.8 kg) IBW/kg (Calculated) : 66.1  Temp (24hrs), Avg:97.4 F (36.3 C), Min:97.2 F (36.2 C), Max:97.5 F (36.4 C)  Recent Labs  Lab 12/06/17 0502 12/06/17 0531 12/06/17 0758  WBC 8.3  --   --   CREATININE 0.74  --   --   LATICACIDVEN  --  2.58* 0.97    Estimated Creatinine Clearance: 69.7 mL/min (by C-G formula based on SCr of 0.74 mg/dL).    No Known Allergies  Antimicrobials this admission: 8/18 cefepime >>   8/18 vancomycin>    Microbiology results: 8/18 BCx2:  NG X12h 8/18 UCx: NG  8/18 MRSA PCR: negative  Thank you for allowing pharmacy to be a part of this patient's care.  Tama Highamara Omere Marti, Pharm. D. Clinical Pharmacist 12/06/2017 10:14 AM

## 2017-12-07 DIAGNOSIS — G9340 Encephalopathy, unspecified: Secondary | ICD-10-CM

## 2017-12-07 LAB — CBC
HCT: 26.9 % — ABNORMAL LOW (ref 39.0–52.0)
Hemoglobin: 8.6 g/dL — ABNORMAL LOW (ref 13.0–17.0)
MCH: 30.3 pg (ref 26.0–34.0)
MCHC: 32 g/dL (ref 30.0–36.0)
MCV: 94.7 fL (ref 78.0–100.0)
Platelets: 89 10*3/uL — ABNORMAL LOW (ref 150–400)
RBC: 2.84 MIL/uL — ABNORMAL LOW (ref 4.22–5.81)
RDW: 24.1 % — ABNORMAL HIGH (ref 11.5–15.5)
WBC: 4.8 10*3/uL (ref 4.0–10.5)

## 2017-12-07 LAB — COMPREHENSIVE METABOLIC PANEL
ALBUMIN: 2 g/dL — AB (ref 3.5–5.0)
ALK PHOS: 49 U/L (ref 38–126)
ALT: 15 U/L (ref 0–44)
ANION GAP: 6 (ref 5–15)
AST: 31 U/L (ref 15–41)
BILIRUBIN TOTAL: 1.8 mg/dL — AB (ref 0.3–1.2)
BUN: 19 mg/dL (ref 8–23)
CALCIUM: 7.6 mg/dL — AB (ref 8.9–10.3)
CO2: 23 mmol/L (ref 22–32)
Chloride: 114 mmol/L — ABNORMAL HIGH (ref 98–111)
Creatinine, Ser: 0.71 mg/dL (ref 0.61–1.24)
GFR calc Af Amer: >60 mL/min (ref >60)
GFR calc non Af Amer: >60 mL/min (ref >60)
GLUCOSE: 83 mg/dL (ref 70–99)
Potassium: 3.6 mmol/L (ref 3.5–5.1)
Sodium: 143 mmol/L (ref 135–145)
TOTAL PROTEIN: 5.2 g/dL — AB (ref 6.5–8.1)

## 2017-12-07 LAB — AMMONIA: Ammonia: 58 umol/L — ABNORMAL HIGH (ref 9–35)

## 2017-12-07 LAB — LACTIC ACID, PLASMA: LACTIC ACID, VENOUS: 1.2 mmol/L (ref 0.5–1.9)

## 2017-12-07 MED ORDER — LORAZEPAM 2 MG/ML IJ SOLN
0.5000 mg | Freq: Once | INTRAMUSCULAR | Status: AC
  Administered 2017-12-07: 0.5 mg via INTRAVENOUS
  Filled 2017-12-07: qty 1

## 2017-12-07 MED ORDER — ENSURE ENLIVE PO LIQD
237.0000 mL | Freq: Two times a day (BID) | ORAL | Status: DC
  Administered 2017-12-08 – 2017-12-10 (×5): 237 mL via ORAL

## 2017-12-07 MED ORDER — LORAZEPAM 2 MG/ML IJ SOLN
1.0000 mg | Freq: Once | INTRAMUSCULAR | Status: AC
  Administered 2017-12-07: 1 mg via INTRAVENOUS
  Filled 2017-12-07: qty 1

## 2017-12-07 NOTE — Progress Notes (Signed)
Initial Nutrition Assessment  DOCUMENTATION CODES:   Severe malnutrition in context of chronic illness  INTERVENTION:  Low sodium/small frequent meals   Ensure Enlive po BID, each supplement provides 350 kcal and 20 grams of protein     NUTRITION DIAGNOSIS:  Severe Malnutrition related to chronic illness(etoh cirrhosis -acute hepatic encephalapathy) as evidenced by energy intake < or equal to 75% for > or equal to 1 month, severe muscle depletion, moderate fat depletion.  GOAL:  Patient will meet greater than or equal to 90% of their needs    MONITOR:    Po intake, labs and wt trends - response to treatment   REASON FOR ASSESSMENT:  Malnutrition Screening Tool    ASSESSMENT: Patient had recent admission- increased weakness, confusion. History of anemia,  alcoholic cirrhosis- ammonia 55 on admission. Referred to GI due to suspect bleeding. Patient received 1 unit PRBC's. He is s/p EGD- findings/intervention: 3 columns of esophageal varices (banded), changes in the mucosa of the stomach and duodenum. He returned to ED yesterday with complaint of confusion. Acute metabolic encephalopathy. End stage disease. GOC discussed with pt, and family. Hospice to be considered pending pt response to care.   Low sodium diet. Patient did not eat well at breakfast (a few bites of eggs) per his wife. Lunch was better 50-75% of meal.  He takes lactulose daily. Expect based on nutrition hx he has been meeting < 75% of estimated energy needs for >/= 1 month.   Patient has unplanned weight loss over the past 2 years 10 lb loss in 2018, and another 10 lb (7%) in the past 3 months. He has moderate and severe muscle loss upper and lower extremities.   Labs: BMP Latest Ref Rng & Units 12/07/2017 12/06/2017 11/21/2017  Glucose 70 - 99 mg/dL 83 409(W102(H) 79  BUN 8 - 23 mg/dL 19 20 13   Creatinine 0.61 - 1.24 mg/dL 1.190.71 1.470.74 8.290.77  Sodium 135 - 145 mmol/L 143 141 141  Potassium 3.5 - 5.1 mmol/L 3.6 4.4 3.5   Chloride 98 - 111 mmol/L 114(H) 110 116(H)  CO2 22 - 32 mmol/L 23 24 24   Calcium 8.9 - 10.3 mg/dL 7.6(L) 8.8(L) 7.1(L)    Medications reviewed and include: lactulose, folvite, MVI, thiamine and protonix.  NUTRITION - FOCUSED PHYSICAL EXAM:    Most Recent Value  Orbital Region  Mild depletion  Upper Arm Region  Mild depletion  Buccal Region  No depletion  Temple Region  Mild depletion  Clavicle Bone Region  Moderate depletion  Clavicle and Acromion Bone Region  Moderate depletion  Dorsal Hand  No depletion  Patellar Region  Severe depletion  Anterior Thigh Region  Severe depletion  Posterior Calf Region  Moderate depletion  Edema (RD Assessment)  None  Eyes  Reviewed  Skin  Reviewed [jaundice, dry]  Nails  Reviewed      Diet Order:   Diet Order            Diet 2 gram sodium Room service appropriate? Yes; Fluid consistency: Thin  Diet effective now              EDUCATION NEEDS: addressed recent admission- cirrhosis related nutrition therapy  Education needs have been addressed Skin:  Skin Assessment: Reviewed RN Assessment(MASD to sacrum and jaundice skin)  Last BM:  8/18 ty pe 7  Height:   Ht Readings from Last 1 Encounters:  12/06/17 5\' 7"  (1.702 m)    Weight:   Wt Readings from Last 1 Encounters:  12/06/17 65.8 kg    Ideal Body Weight:  67 kg  BMI:  Body mass index is 22.72 kg/m.  Estimated Nutritional Needs:   Kcal:  [id number]  Protein:  75-82 gr  Fluid:  1.8-2.0 liters daily   Royann ShiversLynn Jamaar Howes MS,RD,CSG,LDN Office: [phone] Pager: [phone]

## 2017-12-07 NOTE — Progress Notes (Signed)
Pt restlessness and agitation increasing. Pulling at IV line. Pt's spouse states pt has been awake >24 hr. Dr Sherryll BurgerShah notified. Will continue to monitor.

## 2017-12-07 NOTE — Progress Notes (Signed)
Patient with end-stage hepatic encephalopathy who on previous admission several weeks ago had esophageal banding from portal hypertension and varices who when he was more lucid we discussed never wanting to go back to the hospital and to consider hospice care.  Sent home with home health aides and he had more lethargy than usual home health recommended ER evaluation he was admitted for deterioration of multisystemic failure as well as possible seizures placed empirically on vancomycin and cefepime thus far no source of infection is found blood cultures still pending at the time of this dictation admission ammonia level was 23 patient had been compliant with lactulose at home.  I discussed again with wife and patient that we will send a report of [place] care for him to see if he turns around and 48 or 72 hours and if not we will consider this reestablishing contact with hospice care for home health hospice care. Bradley DoneJoseph G Soledad Jr. [id number] DOB: 03/03/1941 DOA: 12/06/2017 PCP: Bradley Beltran, Bradley Krotzer, Bradley Beltran   Physical Exam: Blood pressure 134/75, pulse 71, temperature 97.8 F (36.6 C), temperature source Oral, resp. rate 19, height 5\' 7"  (1.702 Beltran), weight 65.8 kg, SpO2 96 %.  Lungs clear to A&P no rales wheeze or rhonchi heart regular rhythm no S3-S4 no heaves or rubs abdomen protuberant bowel sounds normoactive no guarding or rebound no masses no megaly   Investigations:  Recent Results (from the past 240 hour(s))  Blood culture (routine x 2)     Status: None (Preliminary result)   Collection Time: 12/06/17  5:50 AM  Result Value Ref Range Status   Specimen Description LEFT ANTECUBITAL  Final   Special Requests   Final    BOTTLES DRAWN AEROBIC AND ANAEROBIC Blood Culture adequate volume   Culture   Final    NO GROWTH 1 DAY Performed at [facility], [address] [place], [place] [address]    Report Status PENDING  Incomplete  Blood culture (routine x 2)     Status: None (Preliminary result)   Collection Time: 12/06/17  5:56 AM  Result Value Ref Range Status   Specimen Description BLOOD RIGHT HAND  Final   Special Requests   Final    BOTTLES DRAWN AEROBIC ONLY Blood Culture results may not be optimal due to an inadequate volume of blood received in culture bottles   Culture   Final    NO GROWTH 1 DAY Performed at [facility], [address] [place], [place] [address]    Report Status PENDING  Incomplete     Basic Metabolic Panel: Recent Labs    12/06/17 0502 12/07/17 0510  NA 141 143  K 4.4 3.6  CL 110 114*  CO2 24 23  GLUCOSE 102* 83  BUN 20 19  CREATININE 0.74 0.71  CALCIUM 8.8* 7.6*   Liver Function Tests: Recent Labs    12/06/17 0502 12/07/17 0510  AST 46* 31  ALT 21 15  ALKPHOS 86 49  BILITOT 2.9* 1.8*  PROT 7.9 5.2*  ALBUMIN 3.1* 2.0*     CBC: Recent Labs    12/06/17 0502 12/07/17 0510  WBC 8.3 4.8  HGB 13.3 8.6*  HCT 41.8 26.9*  MCV 94.4 94.7  PLT 138* 89*    Dg Chest 2 View  Result Date: 12/06/2017 CLINICAL DATA:  Altered mental status. EXAM: CHEST - 2 VIEW COMPARISON:  11/16/2017 FINDINGS: Moderate bilateral pleural effusions with basilar atelectasis in the lungs. Heart size is obscured by the parenchymal process but appears enlarged. Pleural  effusions have increased since previous study. No pneumothorax. Mediastinal contours appear intact. IMPRESSION: Increasing bilateral pleural effusions and basilar atelectasis. Cardiac enlargement. Electronically Signed   By: Burman NievesWilliam  Stevens Beltran.D.   On: 12/06/2017 06:20   Ct Head Wo Contrast  Result Date: 12/06/2017 CLINICAL DATA:  Initial evaluation for acute altered mental status. EXAM: CT HEAD WITHOUT CONTRAST TECHNIQUE: Contiguous axial images were obtained from the base of the skull through the vertex without intravenous contrast. COMPARISON:  Prior MRI from 11/17/2017. FINDINGS: Brain: Examination mildly degraded by motion artifact. Moderate atrophy with chronic small vessel ischemic disease. No  acute intracranial hemorrhage. No acute large vessel territory infarct. No mass lesion, midline shift or mass effect. No hydrocephalus. No extra-axial fluid collection. Vascular: No hyperdense vessel. Skull: Scalp soft tissues demonstrate no acute abnormality. Calvarium intact. Sinuses/Orbits: Globes and orbital soft tissues within normal limits. Punctate metallic density noted anterior to the left globe. Visualized paranasal sinuses are clear. Mastoid air cells are clear. Other: None. IMPRESSION: 1. No acute intracranial abnormality. 2. Moderate cerebral atrophy with chronic small vessel ischemic disease. Electronically Signed   By: Rise MuBenjamin  McClintock Beltran.D.   On: 12/06/2017 06:21      Medicatio  Impression:  Active Problems:   Thrombocytopenia, unspecified (HCC)   Alcoholic cirrhosis (HCC)   Protein-calorie malnutrition, severe   Acute encephalopathy     Plan: Continue empiric vancomycin and cefepime monitor ammonia levels supportive care nutritionally as well as monitoring any coagulopathy.  If patient does not improve or if he dwindles will consider establishing contact with hospice care as previously discussed another admission he is a no code DO NOT RESUSCITATE  Consultants:    Procedures   Antibiotics: Vancomycin and cefepime  Time spent: 45 minutes   LOS: 1 day   Bradley Beltran   12/07/2017, 1:36 PM

## 2017-12-07 NOTE — Progress Notes (Signed)
Ativan 1 mg IV given for restlessness and agitation.  We will continue to monitor.

## 2017-12-08 LAB — CBC WITH DIFFERENTIAL/PLATELET
Basophils Absolute: 0 10*3/uL (ref 0.0–0.1)
Basophils Relative: 0 %
EOS ABS: 0.4 10*3/uL (ref 0.0–0.7)
EOS PCT: 8 %
HCT: 28.4 % — ABNORMAL LOW (ref 39.0–52.0)
HEMOGLOBIN: 9 g/dL — AB (ref 13.0–17.0)
Lymphocytes Relative: 26 %
Lymphs Abs: 1.2 10*3/uL (ref 0.7–4.0)
MCH: 29.9 pg (ref 26.0–34.0)
MCHC: 31.7 g/dL (ref 30.0–36.0)
MCV: 94.4 fL (ref 78.0–100.0)
Monocytes Absolute: 0.6 10*3/uL (ref 0.1–1.0)
Monocytes Relative: 12 %
NEUTROS PCT: 54 %
Neutro Abs: 2.5 10*3/uL (ref 1.7–7.7)
PLATELETS: 84 10*3/uL — AB (ref 150–400)
RBC: 3.01 MIL/uL — AB (ref 4.22–5.81)
RDW: 24 % — ABNORMAL HIGH (ref 11.5–15.5)
WBC: 4.6 10*3/uL (ref 4.0–10.5)

## 2017-12-08 LAB — URINE CULTURE: Culture: NO GROWTH

## 2017-12-08 LAB — AMMONIA: AMMONIA: 39 umol/L — AB (ref 9–35)

## 2017-12-08 NOTE — Clinical Social Work Note (Signed)
Received CSW consult for home hospice. Reviewed pt's record. If pt does indeed plan to return home with hospice, RN CM will arrange. RN CM has been consulted by MD for same request. Will clear CSW consult for now. Will be available for re-consult if plan involves CSW needs.

## 2017-12-08 NOTE — Care Management Note (Signed)
Case Management Note  Patient Details  Name: Bradley Beltran. MRN: [id number] Date of Birth: Jun 06, 1940  Subjective/Objective:              Acute encephalopathy. From home with wife. CM consulted for hospice referral at request of family.  Attending has met with patient today, who is now alert and oriented and wishes to continue home health services. Patient is active with [facility].       Action/Plan: Bradley Beltran of [facility] is aware of patient and will obtain resumption order when available via Epic.   Expected Discharge Date:    12/10/2017              Expected Discharge Plan:  [place]  In-House Referral:     Discharge planning Services  CM Consult  Post Acute Care Choice:  Home Health, Resumption of Svcs/PTA Provider Choice offered to:     DME Arranged:    DME Agency:     HH Arranged:    [place] Agency:  [place]  Status of Service:  Completed, signed off  If discussed at [place] of Stay Meetings, dates discussed:    Additional Comments:  Bradley Beltran, Chauncey Reading, RN 12/08/2017, 1:20 PM

## 2017-12-08 NOTE — Progress Notes (Signed)
Patient is alert oriented and conversive this morning with family and myself much improved ammonia level 39 significant change no other lab work she is talking about wanting to go home.  He states should he deteriorate at home he wants to go back to the hospital which is different than conversations had 3 weeks ago so therefore hospice is not a consideration at present rather home health nurse fits that criteria better Bradley Beltran. [id number] DOB: Aug 09, 1940 DOA: 12/06/2017 PCP: Lucia Gaskins, MD   Physical Exam: Blood pressure (!) 119/54, pulse 63, temperature 97.9 F (36.6 C), temperature source Axillary, resp. rate 18, height 5' 7"  (1.702 m), weight 65.8 kg, SpO2 93 %.  Lungs clear to A&P no rales wheeze rhonchi heart regular rhythm no murmurs gallops heaves thrills rubs abdomen soft nontender bowel sounds normoactive no guarding no rebound   Investigations:  Recent Results (from the past 240 hour(s))  Blood culture (routine x 2)     Status: None (Preliminary result)   Collection Time: 12/06/17  5:50 AM  Result Value Ref Range Status   Specimen Description LEFT ANTECUBITAL  Final   Special Requests   Final    BOTTLES DRAWN AEROBIC AND ANAEROBIC Blood Culture adequate volume   Culture   Final    NO GROWTH 2 DAYS Performed at [facility], [address], [place], [place] [address]    Report Status PENDING  Incomplete  Blood culture (routine x 2)     Status: None (Preliminary result)   Collection Time: 12/06/17  5:56 AM  Result Value Ref Range Status   Specimen Description BLOOD RIGHT HAND  Final   Special Requests   Final    BOTTLES DRAWN AEROBIC ONLY Blood Culture results may not be optimal due to an inadequate volume of blood received in culture bottles   Culture   Final    NO GROWTH 2 DAYS Performed at [facility], [address], [place], [place] [address]    Report Status PENDING  Incomplete  Urine Culture     Status: None   Collection Time: 12/06/17  6:30 AM   Result Value Ref Range Status   Specimen Description   Final    URINE, RANDOM Performed at [facility], [address], [place], [place] [address]    Special Requests   Final    NONE Performed at [facility], [address] [place], [place] [address]    Culture   Final    NO GROWTH Performed at [facility], [place] [address] [place], [place] [address]    Report Status 12/08/2017 FINAL  Final     Basic Metabolic Panel: Recent Labs    12/06/17 0502 12/07/17 0510  NA 141 143  K 4.4 3.6  CL 110 114*  CO2 24 23  GLUCOSE 102* 83  BUN 20 19  CREATININE 0.74 0.71  CALCIUM 8.8* 7.6*   Liver Function Tests: Recent Labs    12/06/17 0502 12/07/17 0510  AST 46* 31  ALT 21 15  ALKPHOS 86 49  BILITOT 2.9* 1.8*  PROT 7.9 5.2*  ALBUMIN 3.1* 2.0*     CBC: Recent Labs    12/07/17 0510 12/08/17 0536  WBC 4.8 4.6  NEUTROABS  --  2.5  HGB 8.6* 9.0*  HCT 26.9* 28.4*  MCV 94.7 94.4  PLT 89* 84*    No results found.    Medications:   Impression:  Active Problems:   Thrombocytopenia, unspecified (HCC)   Alcoholic cirrhosis (  [place])   Protein-calorie malnutrition, severe   Acute encephalopathy     Plan: Discontinue Ativan add trazodone 50 mg p.o. at bedtime for agitation and insomnia check be met in a.m.  Consultants:    Procedures   Antibiotics: Vancomycin and           Time spent: 45 minutes   LOS: 2 days   Tyke Outman M   12/08/2017, 12:54 PM

## 2017-12-08 NOTE — Care Management Important Message (Signed)
Important Message  Patient Details  Name: Bradley DoneJoseph G Walters Jr. MRN: [id number] Date of Birth: 04-08-1941   Medicare Important Message Given:  Yes    Renie OraHawkins, Maebelle Sulton Smith 12/08/2017, 10:04 AM

## 2017-12-09 DIAGNOSIS — Z7189 Other specified counseling: Secondary | ICD-10-CM

## 2017-12-09 DIAGNOSIS — Z515 Encounter for palliative care: Secondary | ICD-10-CM

## 2017-12-09 LAB — VANCOMYCIN, TROUGH: VANCOMYCIN TR: 9 ug/mL — AB (ref 15–20)

## 2017-12-09 LAB — CREATININE, SERUM: Creatinine, Ser: 0.77 mg/dL (ref 0.61–1.24)

## 2017-12-09 MED ORDER — VANCOMYCIN HCL IN DEXTROSE 750-5 MG/150ML-% IV SOLN
750.0000 mg | Freq: Two times a day (BID) | INTRAVENOUS | Status: DC
  Administered 2017-12-09 – 2017-12-10 (×2): 750 mg via INTRAVENOUS
  Filled 2017-12-09 (×5): qty 150

## 2017-12-09 NOTE — Progress Notes (Signed)
Palliative: [name] is resting quietly in bed.  He greets me making and keeping eye contact.  He appears chronically ill and frail, but is alert and oriented x3.  Present today at bedside is his wife.  He has no complaints at this time, other than his desire to return to his home. [name] says that his goal is to return home with the benefits of hosp[facility] for in-home, treat the treatable hospice care.  Do not rehospitalize.  His desire is to leave today.  These goals are in line with the goals discussed with family during the 7/31 visit.  Wife states that she is in agreement with these goals.  She shares her experiences with [facility] as her mother passed with cancer. Conference with PCP related to patient's goals.  Plan discharge for tomorrow. Conference with case management for hospice referral, in home services, family request transport via ambulance. Conference with nursing staff related to plan of care, discharge. 45 minutes Bradley Carmelasha Dove, NP Palliative Medicine Team Team Phone # [phone]  Greater than 50% of this time was spent counseling and coordinating care related to the above assessment and plan.

## 2017-12-09 NOTE — Care Management (Signed)
Patient Information   Patient Name Bradley Beltran, Bradley G Jr. ([id number]) Sex Male DOB 03/01/1941  Room Bed  A312 A312-01  Patient Demographics   Address [facility]  [place] [id number] Phone [phone] (Home)  Patient Ethnicity & Race   Ethnic Group Patient Race  Not Hispanic or Latino White or Caucasian  Emergency Contact(s)   Name Relation Home Work Mobile  Looney,Debbie Spouse [phone]    Pyrtle,Crystal Daughter   [phone]  Documents on File    Status Date Received Description  Documents for the Patient  Historic Radiology Documentation Not Received    Historic Radiology Documentation Not Received    [place] HIPAA NOTICE OF PRIVACY - Scanned Received 06/08/12   [place] E-Signature HIPAA Notice of Privacy Received 12/02/12   Driver's License Received 08/04/17 Drivers license [id number]  Insurance Card Received 06/16/12 Medicare  Advance Directives/Living Will/HCPOA/POA Not Received    Insurance Card Not Received    [place] HIPAA NOTICE OF PRIVACY - Scanned Not Received    Insurance Card Received 09/06/12 RCGD  Insurance Card Received 06/16/12   [place] HIPAA NOTICE OF PRIVACY - Scanned Not Received    AMB HH/NH/Hospice Not Received  02/14 order Adv [facility]   [place] HIPAA NOTICE OF PRIVACY - Scanned Received 09/06/12 RCGD  Release of Information Received 09/06/12 DPR - RCGD  HIM ROI Authorization Not Received  RCGD--10/27/12 prog note to PCP  HIM ROI Authorization Not Received  RCGD--11/25/12 prog note to PCP  HIM ROI Authorization Not Received  RCGD--01/25/13 prog note to PCP ([place])  HIM ROI Authorization  05/04/13 RCGD--04/26/13 prog note to PCP ([place])  HIM ROI Authorization  05/12/13 RCGD--04/26/13 labs & US report to PCP [employer](d[place]go)  Insurance Card Received 07/14/13 Medicare  Insurance Card  07/14/13   HIM ROI Authorization  07/29/13 RCGD--07/25/13 prog note to PCP ([place])  HIM ROI Authorization  08/01/13 RCGD--07/25/13 labs to PCP  ([place])  HIM ROI Authorization  02/15/14 RCGD--01/24/14 prog note to PCP ([place])  HIM ROI Authorization  02/17/14 RCGD--01/24/14 labs and 01/25/14 US report to PCP (d[place])  Other Photo ID Not Received    HIM ROI Authorization  08/01/14 RCGD--07/31/14 prog note to PCP ([place])  HIM ROI Authorization  08/11/14 RCGD--07/31/14 labs & 08/03/14 US report to PCP ([place])  HIM ROI Authorization  02/06/15 RCGD--01/30/15 prog note to PCP ([place])  HIM ROI Authorization  02/09/15 RCGD--01/31/15 US report to PCP [employer]([place])  Insurance Card Received 08/07/15 Mcr/RCGD/at  HIM ROI Authorization  09/14/15 RCGD--08/07/15 labs to PCP [employer](d[place])  Insurance Card Received 02/03/17 MEDICARE/RCGD/HHS  Insurance Card     Insurance Card Received 08/04/17 UHC MCR complete - active - 2019  HIM ROI Authorization  08/10/17 RCGD--08/04/17 labs to PCP Janna Arch([place])  HIM ROI Authorization  08/11/17 DONDIEGO,RICHARD MD  HIM ROI Authorization  08/11/17 RCGD--08/11/17 US report to PCP Janna Arch(d[place])  HIM Release of Information Output (Deleted) 08/10/17 Requested records  HIM Release of Information Output (Deleted) 08/11/17 Requested records  HIM Release of Information Output (Deleted) 08/11/17 Requested records  Documents for the Encounter  AOB (Assignment of Insurance Benefits) Not Received 12/08/17 AOB  E-signature AOB Signed 12/06/17   MEDICARE RIGHTS Received 12/08/17 MCARE RIGHTS  E-signature Medicare Rights Signed 12/08/17   ED Patient Billing Extract   ED PB Billing Extract  Cardiac Monitoring Strip Received 12/06/17   Cardiac Monitoring Strip Shift Summary Received 12/06/17   Admission Information   Attending Provider Admitting Provider Admission Type Admission Date/Time  [name], Richard, MD Maurilio LovelyShah, Pratik  D, DO Emergency 12/06/17 0443  Discharge Date Hospital Service Auth/Cert Status Service Area   Internal Medicine Incomplete [facility]  Unit Room/Bed Admission Status   AP-DEPT 300 A312/A312-01  Admission (Confirmed)   Admission   Complaint  Altered Mental Status  Hospital Account   Name Acct ID Class Status Primary Coverage  Bradley Beltran, Bradley G Jr. [id number] Inpatient Open [employer]ALTHCARE MEDICARE [facility]      Guarantor Account (for Hospital Account [id number])   Name Relation to Pt Service Area Active? Acct Type  Bradley Beltran, Bradley G Jr. Self CHSA Yes Personal/Family  Address Phone    [address] [place], [place] [address] [phone](H)        Coverage Information (for Hospital Account [id number])   F/O Payor/Plan Precert #  [facility]/UHC MEDICARE   Subscriber Subscriber #  Bradley Beltran, Bradley G Jr. [id number]  Address Phone  [address] [place], [place] [id number]

## 2017-12-09 NOTE — Progress Notes (Signed)
She has now reversed this decision from yesterday states he wants home hospice at his home and not to return to the hospital should he deteriorate at home.  Will get hospice and palliative care consult respecting his wishes plan is for CBC and be met in a.m. Bradley Beltran. [id number]:[id number] DOB: 02-11-1941 DOA: 12/06/2017 PCP: Bradley Gaskins, MD   Physical Exam: Blood pressure (!) 110/53, pulse 72, temperature 98.1 F (36.7 C), temperature source Oral, resp. rate 18, height 5' 7"  (1.702 m), weight 65.8 kg, SpO2 95 %.  Lungs clear to A&P no rales wheeze or rhonchi heart regular rhythm no S3-S4 no heaves thrills rubs abdomen soft nontender bowel sounds normoactive no guarding no rebound   Investigations:  Recent Results (from the past 240 hour(s))  Blood culture (routine x 2)     Status: None (Preliminary result)   Collection Time: 12/06/17  5:50 AM  Result Value Ref Range Status   Specimen Description LEFT ANTECUBITAL  Final   Special Requests   Final    BOTTLES DRAWN AEROBIC AND ANAEROBIC Blood Culture adequate volume   Culture   Final    NO GROWTH 3 DAYS Performed at [facility], [address] [place], [place] [address]    Report Status PENDING  Incomplete  Blood culture (routine x 2)     Status: None (Preliminary result)   Collection Time: 12/06/17  5:56 AM  Result Value Ref Range Status   Specimen Description BLOOD RIGHT HAND  Final   Special Requests   Final    BOTTLES DRAWN AEROBIC ONLY Blood Culture results may not be optimal due to an inadequate volume of blood received in culture bottles   Culture   Final    NO GROWTH 3 DAYS Performed at [facility], [address], [place], [place] [address]    Report Status PENDING  Incomplete  Urine Culture     Status: None   Collection Time: 12/06/17  6:30 AM  Result Value Ref Range Status   Specimen Description   Final    URINE, RANDOM Performed at [facility], [address] [place], [place] [address]    Special Requests    Final    NONE Performed at [facility], [address] [place], [place] [address]    Culture   Final    NO GROWTH Performed at [facility], [facility] [address], [place], [place] [address]    Report Status 12/08/2017 FINAL  Final     Basic Metabolic Panel: Recent Labs    12/07/17 0510 12/09/17 0520  NA 143  --   K 3.6  --   CL 114*  --   CO2 23  --   GLUCOSE 83  --   BUN 19  --   CREATININE 0.71 0.77  CALCIUM 7.6*  --    Liver Function Tests: Recent Labs    12/07/17 0510  AST 31  ALT 15  ALKPHOS 49  BILITOT 1.8*  PROT 5.2*  ALBUMIN 2.0*     CBC: Recent Labs    12/07/17 0510 12/08/17 0536  WBC 4.8 4.6  NEUTROABS  --  2.5  HGB 8.6* 9.0*  HCT 26.9* 28.4*  MCV 94.7 94.4  PLT 89* 84*    No results found.    Medications:  Impression:  Active Problems:   Thrombocytopenia, unspecified ([place])   Alcoholic cirrhosis (HCC)   Protein-calorie malnutrition, severe   Acute encephalopathy     Plan: CBC and be met  in a.m. continue lactulose 20 g p.o. twice daily we will get palliative care consult as well as hospice consult per patient and family's wishes  Consultants: Palliative care   Procedures   Antibiotics: Vancomycin and Maxipime     Time spent: 30 minutes   LOS: 3 days   Bradley Beltran M   12/09/2017, 11:22 AM

## 2017-12-09 NOTE — Care Management (Addendum)
[facility].   ADDENDUM: Referral received, [facility]andra of Hospice will call patient's wife this afternoon. Anticipate DC home tomorrow.

## 2017-12-09 NOTE — Progress Notes (Signed)
Pharmacy Antibiotic Note  Bradley DoneJoseph G Ikner Jr. is a 77 y.o. male admitted on 12/06/2017 with possible SIRS.  Pharmacy has been consulted for vancomycin and cefepime  dosing.  Continue cefepime 1g IV q12h Increase vancomycin to 750 mg IV q12 hours. Goal trough: 15-20 mcg/mL Monitor labs, c/s, and vanco troughs as indicated   Height: 5\' 7"  (170.2 cm) Weight: 145 lb 1 oz (65.8 kg) IBW/kg (Calculated) : 66.1  Temp (24hrs), Avg:98.1 F (36.7 C), Min:98.1 F (36.7 C), Max:98.1 F (36.7 C)  Recent Labs  Lab 12/06/17 0502 12/06/17 0531 12/06/17 0758 12/06/17 1030 12/07/17 0510 12/08/17 0536 12/09/17 0520  WBC 8.3  --   --   --  4.8 4.6  --   CREATININE 0.74  --   --   --  0.71  --  0.77  LATICACIDVEN  --  2.58* 0.97 1.5 1.2  --   --   VANCOTROUGH  --   --   --   --   --   --  9*    Estimated Creatinine Clearance: 69.7 mL/min (by C-G formula based on SCr of 0.77 mg/dL).    No Known Allergies  Antimicrobials this admission: 8/18 cefepime >>   8/18 vancomycin>    Microbiology results: 8/18 BCx2:  ngtd 8/18 UCx: NG    Thank you for allowing pharmacy to be a part of this patient's care.  Judeth CornfieldSteven Johnette Teigen, PharmD Clinical Pharmacist 12/09/2017 11:28 AM

## 2017-12-10 NOTE — Care Management (Signed)
DC home today with hospice services. DC summary routed to Hospice. EMS transport will be arranged.

## 2017-12-10 NOTE — Progress Notes (Signed)
Palliative: [name] is sitting up on the edge of the bed fully dressed.  He greets me making and keeping eye contact.  His color looks good, and he tells me he is happy that he is being discharged today.  Present today at bedside his wife.  We talked about discharge plan.  They shared that he will go home in the car.  I encouraged [name] to take it easy, go slowly.  We talked about symptom management, I encourage family to call hospice provider as needed.  I shared that the hospice should be the first phone call.  Bradley Beltran shares that she spoke with the hospice provider yesterday, she is to call them when they arrive home.  No questions or concerns at this time. 15 minutes Bradley Carmelasha Dove, NP Palliative Medicine Team Team Phone # [phone]  Greater than 50% of this time was spent counseling and coordinating care related to the above assessment and plan.

## 2017-12-10 NOTE — Discharge Summary (Signed)
Physician Discharge Summary  Bradley DoneJoseph G Toro Jr. [id number] DOB: 1940/09/11 DOA: 12/06/2017  PCP: [name], Destyn Schuyler, MD  Admit date: 12/06/2017 Discharge date: 12/10/2017   Recommendations for Outpatient Follow-up:  Patient is discharged to home care with home hospice care with a clear understanding between the patient his wife myself and palliative care team that he will not return to hospital should he deteriorate.  He is to take all preadmission hospital medicines as prescribed.  He is welcome to follow my office in 1 week's time and to continue his lactulose 20 g p.o. twice daily Discharge Diagnoses:  Active Problems:   Thrombocytopenia, unspecified (HCC)   Alcoholic cirrhosis (HCC)   Protein-calorie malnutrition, severe   Acute encephalopathy   Discharge Condition: Floreen ComberGuarded  Filed Weights   12/06/17 0447 12/06/17 0910  Weight: 64.5 kg 65.8 kg    History of present illness:  The patient is a 77 year old white male with end-stage hepatic encephalopathy with 2 episodes of deterioration he was admitted to the hospital through 4 weeks ago placed in ICU given lactulose enemas and he decided on hospice at that time however he had home health care nurses and a referred the patient to the ER for fever and chills he was admitted placed on empiric vancomycin and Maxipime for questionable seizures blood cultures urine cultures chest x-ray were all negative there was no significant leukocytosis and he was treated with a dual antibiotics for peer to 4 to 5 days he improved his cognitive functioning is a mission ammonia level was only 29 and during this hospital stay we had palliative care team again reconsult it was decided by the patient the wife the daughter and agreed upon by myself that he should undergo hospice care at home and he desires not to return to the hospital should he deteriorate the patient is a no code DO NOT INTUBATE DO NOT RESUSCITATE.  Hospital Course:  See HPI  above  Procedures:    Consultations:  Palliative care team and hospice home care  Discharge Instructions  Discharge Instructions    Discharge instructions   Complete by:  As directed    Discharge instructions   Complete by:  As directed      Allergies as of 12/10/2017   No Known Allergies     Medication List    STOP taking these medications   acetaminophen 500 MG tablet Commonly known as:  TYLENOL   cholecalciferol 1000 units tablet Commonly known as:  VITAMIN D   feeding supplement (PRO-STAT SUGAR FREE 64) Liqd   multivitamin with minerals Tabs tablet   potassium chloride SA 20 MEQ tablet Commonly known as:  K-DUR,KLOR-CON     TAKE these medications   albuterol 108 (90 Base) MCG/ACT inhaler Commonly known as:  PROVENTIL HFA;VENTOLIN HFA Inhale 2 puffs into the lungs 4 (four) times daily.   folic acid 1 MG tablet Commonly known as:  FOLVITE Take 1 tablet (1 mg total) by mouth daily.   lactulose 10 GM/15ML solution Commonly known as:  CHRONULAC Take 30 mLs (20 g total) by mouth 2 (two) times daily.   pantoprazole 40 MG tablet Commonly known as:  PROTONIX Take 1 tablet (40 mg total) by mouth 2 (two) times daily.   propranolol 10 MG tablet Commonly known as:  INDERAL Take 1 tablet (10 mg total) by mouth 2 (two) times daily.      No Known Allergies    The results of significant diagnostics from this hospitalization (including imaging, microbiology,  ancillary and laboratory) are listed below for reference.    Significant Diagnostic Studies: Dg Chest 2 View  Result Date: 12/06/2017 CLINICAL DATA:  Altered mental status. EXAM: CHEST - 2 VIEW COMPARISON:  11/16/2017 FINDINGS: Moderate bilateral pleural effusions with basilar atelectasis in the lungs. Heart size is obscured by the parenchymal process but appears enlarged. Pleural effusions have increased since previous study. No pneumothorax. Mediastinal contours appear intact. IMPRESSION: Increasing  bilateral pleural effusions and basilar atelectasis. Cardiac enlargement. Electronically Signed   By: Burman Nieves M.D.   On: 12/06/2017 06:20   Dg Chest 2 View  Result Date: 11/16/2017 CLINICAL DATA:  Confusion EXAM: CHEST - 2 VIEW COMPARISON:  09/03/2012 FINDINGS: Small bilateral pleural effusions. Left greater than right bibasilar airspace disease. Normal heart size. No pneumothorax. IMPRESSION: Bilateral pleural effusions with bibasilar consolidations which may reflect atelectasis or pneumonia. Electronically Signed   By: Jasmine Pang M.D.   On: 11/16/2017 14:32   Ct Head Wo Contrast  Result Date: 12/06/2017 CLINICAL DATA:  Initial evaluation for acute altered mental status. EXAM: CT HEAD WITHOUT CONTRAST TECHNIQUE: Contiguous axial images were obtained from the base of the skull through the vertex without intravenous contrast. COMPARISON:  Prior MRI from 11/17/2017. FINDINGS: Brain: Examination mildly degraded by motion artifact. Moderate atrophy with chronic small vessel ischemic disease. No acute intracranial hemorrhage. No acute large vessel territory infarct. No mass lesion, midline shift or mass effect. No hydrocephalus. No extra-axial fluid collection. Vascular: No hyperdense vessel. Skull: Scalp soft tissues demonstrate no acute abnormality. Calvarium intact. Sinuses/Orbits: Globes and orbital soft tissues within normal limits. Punctate metallic density noted anterior to the left globe. Visualized paranasal sinuses are clear. Mastoid air cells are clear. Other: None. IMPRESSION: 1. No acute intracranial abnormality. 2. Moderate cerebral atrophy with chronic small vessel ischemic disease. Electronically Signed   By: Rise Mu M.D.   On: 12/06/2017 06:21   Ct Head Wo Contrast  Result Date: 11/16/2017 CLINICAL DATA:  Altered mental status. EXAM: CT HEAD WITHOUT CONTRAST CT CERVICAL SPINE WITHOUT CONTRAST TECHNIQUE: Multidetector CT imaging of the head and cervical spine was  performed following the standard protocol without intravenous contrast. Multiplanar CT image reconstructions of the cervical spine were also generated. COMPARISON:  CT scan of August 08, 2004. FINDINGS: CT HEAD FINDINGS Brain: Mild diffuse cortical atrophy is noted. Mild chronic ischemic white matter disease is noted. No mass effect or midline shift is noted. Ventricular size is within normal limits. There is no evidence of mass lesion, hemorrhage or acute infarction. Vascular: No hyperdense vessel or unexpected calcification. Skull: Normal. Negative for fracture or focal lesion. Sinuses/Orbits: No acute finding. Other: None. CT CERVICAL SPINE FINDINGS Alignment: Normal. Skull base and vertebrae: No acute fracture. No primary bone lesion or focal pathologic process. Soft tissues and spinal canal: No prevertebral fluid or swelling. No visible canal hematoma. Disc levels: There appears to be fusion of C3-4. Moderate degenerative disc disease is noted at C5-6 and C6-7. Upper chest: Large bilateral pleural effusions are noted. Other: Degenerative changes seen involving posterior facet joints bilaterally. IMPRESSION: Mild diffuse cortical atrophy. Mild chronic ischemic white matter disease. No acute intracranial abnormality seen. Multilevel degenerative disc disease. No acute abnormality seen in the cervical spine. Large bilateral pleural effusions are seen within the visualized lung apices. Chest radiograph is recommended for further evaluation. Electronically Signed   By: Lupita Raider, M.D.   On: 11/16/2017 15:06   Ct Cervical Spine Wo Contrast  Result Date: 11/16/2017 CLINICAL DATA:  Altered mental status. EXAM: CT HEAD WITHOUT CONTRAST CT CERVICAL SPINE WITHOUT CONTRAST TECHNIQUE: Multidetector CT imaging of the head and cervical spine was performed following the standard protocol without intravenous contrast. Multiplanar CT image reconstructions of the cervical spine were also generated. COMPARISON:  CT scan  of August 08, 2004. FINDINGS: CT HEAD FINDINGS Brain: Mild diffuse cortical atrophy is noted. Mild chronic ischemic white matter disease is noted. No mass effect or midline shift is noted. Ventricular size is within normal limits. There is no evidence of mass lesion, hemorrhage or acute infarction. Vascular: No hyperdense vessel or unexpected calcification. Skull: Normal. Negative for fracture or focal lesion. Sinuses/Orbits: No acute finding. Other: None. CT CERVICAL SPINE FINDINGS Alignment: Normal. Skull base and vertebrae: No acute fracture. No primary bone lesion or focal pathologic process. Soft tissues and spinal canal: No prevertebral fluid or swelling. No visible canal hematoma. Disc levels: There appears to be fusion of C3-4. Moderate degenerative disc disease is noted at C5-6 and C6-7. Upper chest: Large bilateral pleural effusions are noted. Other: Degenerative changes seen involving posterior facet joints bilaterally. IMPRESSION: Mild diffuse cortical atrophy. Mild chronic ischemic white matter disease. No acute intracranial abnormality seen. Multilevel degenerative disc disease. No acute abnormality seen in the cervical spine. Large bilateral pleural effusions are seen within the visualized lung apices. Chest radiograph is recommended for further evaluation. Electronically Signed   By: Lupita Raider, M.D.   On: 11/16/2017 15:06   Mr Laqueta Jean ZO Contrast  Result Date: 11/17/2017 CLINICAL DATA:  Altered mental status. EXAM: MRI HEAD WITHOUT AND WITH CONTRAST TECHNIQUE: Multiplanar, multiecho pulse sequences of the brain and surrounding structures were obtained without and with intravenous contrast. CONTRAST:  10mL MULTIHANCE GADOBENATE DIMEGLUMINE 529 MG/ML IV SOLN COMPARISON:  Head CT 11/16/2017 Brain MRI 02/26/2004 FINDINGS: The study is degraded by motion, despite efforts to reduce this artifact, including utilization of motion-resistant MR sequences. The findings of the study are interpreted in  the context of reduced sensitivity/specificity. BRAIN: There is no acute infarct, acute hemorrhage or mass effect. Early confluent hyperintense T2-weighted signal of the periventricular and deep white matter, most commonly due to chronic ischemic microangiopathy. Generalized atrophy without lobar predilection. VASCULAR: Major intracranial arterial and venous sinus flow voids are preserved. SKULL AND UPPER CERVICAL SPINE: The visualized skull base, calvarium, upper cervical spine and extracranial soft tissues are normal. SINUSES/ORBITS: No fluid levels or advanced mucosal thickening. No mastoid or middle ear effusion. The orbits are normal. IMPRESSION: 1. Severely motion degraded examination. 2. Within that limitation, no acute intracranial abnormality. 3. Chronic ischemic microangiopathy and generalized atrophy. Electronically Signed   By: Deatra Robinson M.D.   On: 11/17/2017 14:33    Microbiology: Recent Results (from the past 240 hour(s))  Blood culture (routine x 2)     Status: None (Preliminary result)   Collection Time: 12/06/17  5:50 AM  Result Value Ref Range Status   Specimen Description LEFT ANTECUBITAL  Final   Special Requests   Final    BOTTLES DRAWN AEROBIC AND ANAEROBIC Blood Culture adequate volume   Culture   Final    NO GROWTH 4 DAYS Performed at [facility], [address] [place], [place] [address]    Report Status PENDING  Incomplete  Blood culture (routine x 2)     Status: None (Preliminary result)   Collection Time: 12/06/17  5:56 AM  Result Value Ref Range Status   Specimen Description BLOOD RIGHT HAND  Final   Special Requests  Final    BOTTLES DRAWN AEROBIC ONLY Blood Culture results may not be optimal due to an inadequate volume of blood received in culture bottles   Culture   Final    NO GROWTH 4 DAYS Performed at [facility], [address] [place], [place] [address]    Report Status PENDING  Incomplete  Urine Culture     Status: None   Collection Time:  12/06/17  6:30 AM  Result Value Ref Range Status   Specimen Description   Final    URINE, RANDOM Performed at [facility], [address], [place], [place] [address]    Special Requests   Final    NONE Performed at [facility], [address] [place], [place] [address]    Culture   Final    NO GROWTH Performed at [facility], [address], [place], [place] [address]    Report Status 12/08/2017 FINAL  Final     Labs: Basic Metabolic Panel: Recent Labs  Lab 12/06/17 0502 12/07/17 0510 12/09/17 0520  NA 141 143  --   K 4.4 3.6  --   CL 110 114*  --   CO2 24 23  --   GLUCOSE 102* 83  --   BUN 20 19  --   CREATININE 0.74 0.71 0.77  CALCIUM 8.8* 7.6*  --    Liver Function Tests: Recent Labs  Lab 12/06/17 0502 12/07/17 0510  AST 46* 31  ALT 21 15  ALKPHOS 86 49  BILITOT 2.9* 1.8*  PROT 7.9 5.2*  ALBUMIN 3.1* 2.0*   Recent Labs  Lab 12/06/17 0502  LIPASE 66*   Recent Labs  Lab 12/06/17 0502 12/06/17 1030 12/07/17 0510 12/08/17 0536  AMMONIA 33 23 58* 39*   CBC: Recent Labs  Lab 12/06/17 0502 12/07/17 0510 12/08/17 0536  WBC 8.3 4.8 4.6  NEUTROABS  --   --  2.5  HGB 13.3 8.6* 9.0*  HCT 41.8 26.9* 28.4*  MCV 94.4 94.7 94.4  PLT 138* 89* 84*   Cardiac Enzymes: No results for input(s): CKTOTAL, CKMB, CKMBINDEX, TROPONINI in the last 168 hours. BNP: BNP (last 3 results) No results for input(s): BNP in the last 8760 hours.  ProBNP (last 3 results) No results for input(s): PROBNP in the last 8760 hours.  CBG: Recent Labs  Lab 12/06/17 0459  GLUCAP 85       Signed:  Bascom Biel M   Pager: [phone] 12/10/2017, 11:26 AM

## 2017-12-11 LAB — CULTURE, BLOOD (ROUTINE X 2)
CULTURE: NO GROWTH
CULTURE: NO GROWTH
SPECIAL REQUESTS: ADEQUATE

## 2018-01-04 ENCOUNTER — Ambulatory Visit (INDEPENDENT_AMBULATORY_CARE_PROVIDER_SITE_OTHER): Payer: Medicare Other | Admitting: Internal Medicine

## 2018-01-19 DEATH — deceased

## 2018-01-25 ENCOUNTER — Ambulatory Visit (INDEPENDENT_AMBULATORY_CARE_PROVIDER_SITE_OTHER): Payer: Medicare Other | Admitting: Internal Medicine

## 2018-02-09 ENCOUNTER — Ambulatory Visit (INDEPENDENT_AMBULATORY_CARE_PROVIDER_SITE_OTHER): Payer: Medicare Other | Admitting: Internal Medicine

## 2018-02-23 ENCOUNTER — Ambulatory Visit (INDEPENDENT_AMBULATORY_CARE_PROVIDER_SITE_OTHER): Payer: Medicare Other | Admitting: Internal Medicine
